# Patient Record
Sex: Male | Born: 1960 | Race: White | Hispanic: No | Marital: Single | Smoking: Never smoker
Health system: Southern US, Community
[De-identification: ages and names within clinical notes are randomized; demographics above are authoritative.]

## PROBLEM LIST (undated history)

## (undated) DIAGNOSIS — D751 Secondary polycythemia: Secondary | ICD-10-CM

## (undated) DIAGNOSIS — F419 Anxiety disorder, unspecified: Secondary | ICD-10-CM

## (undated) DIAGNOSIS — G250 Essential tremor: Secondary | ICD-10-CM

## (undated) DIAGNOSIS — F32A Depression, unspecified: Secondary | ICD-10-CM

## (undated) DIAGNOSIS — I10 Essential (primary) hypertension: Secondary | ICD-10-CM

## (undated) DIAGNOSIS — H5501 Congenital nystagmus: Secondary | ICD-10-CM

## (undated) DIAGNOSIS — F329 Major depressive disorder, single episode, unspecified: Secondary | ICD-10-CM

## (undated) DIAGNOSIS — T7840XA Allergy, unspecified, initial encounter: Secondary | ICD-10-CM

## (undated) DIAGNOSIS — K219 Gastro-esophageal reflux disease without esophagitis: Secondary | ICD-10-CM

## (undated) HISTORY — DX: Depression, unspecified: F32.A

## (undated) HISTORY — DX: Secondary polycythemia: D75.1

## (undated) HISTORY — DX: Allergy, unspecified, initial encounter: T78.40XA

## (undated) HISTORY — PX: EYE SURGERY: SHX253

## (undated) HISTORY — DX: Essential (primary) hypertension: I10

## (undated) HISTORY — DX: Congenital nystagmus: H55.01

## (undated) HISTORY — PX: SEPTOPLASTY: SUR1290

## (undated) HISTORY — DX: Gastro-esophageal reflux disease without esophagitis: K21.9

## (undated) HISTORY — DX: Anxiety disorder, unspecified: F41.9

## (undated) HISTORY — DX: Major depressive disorder, single episode, unspecified: F32.9

## (undated) HISTORY — DX: Essential tremor: G25.0

---

## 1999-10-05 ENCOUNTER — Ambulatory Visit (HOSPITAL_COMMUNITY): Admission: RE | Admit: 1999-10-05 | Discharge: 1999-10-05 | Payer: Self-pay | Admitting: Gastroenterology

## 2000-05-17 ENCOUNTER — Other Ambulatory Visit: Admission: RE | Admit: 2000-05-17 | Discharge: 2000-05-17 | Payer: Self-pay | Admitting: Gastroenterology

## 2002-11-28 ENCOUNTER — Ambulatory Visit (HOSPITAL_COMMUNITY): Admission: RE | Admit: 2002-11-28 | Discharge: 2002-11-28 | Payer: Self-pay | Admitting: Gastroenterology

## 2003-09-09 ENCOUNTER — Encounter: Admission: RE | Admit: 2003-09-09 | Discharge: 2003-09-09 | Payer: Self-pay | Admitting: Internal Medicine

## 2008-04-03 ENCOUNTER — Ambulatory Visit (HOSPITAL_COMMUNITY): Admission: RE | Admit: 2008-04-03 | Discharge: 2008-04-03 | Payer: Self-pay | Admitting: Otolaryngology

## 2010-06-16 ENCOUNTER — Ambulatory Visit: Payer: Self-pay | Admitting: Oncology

## 2010-07-07 LAB — CBC WITH DIFFERENTIAL/PLATELET
Basophils Absolute: 0 10*3/uL (ref 0.0–0.1)
EOS%: 1.4 % (ref 0.0–7.0)
Eosinophils Absolute: 0.1 10*3/uL (ref 0.0–0.5)
HGB: 17.1 g/dL (ref 13.0–17.1)
MCV: 88.1 fL (ref 79.3–98.0)
MONO%: 6.7 % (ref 0.0–14.0)
NEUT#: 3.9 10*3/uL (ref 1.5–6.5)
RBC: 5.54 10*6/uL (ref 4.20–5.82)
RDW: 13.1 % (ref 11.0–14.6)
lymph#: 2.5 10*3/uL (ref 0.9–3.3)

## 2010-07-07 LAB — MORPHOLOGY
PLT EST: ADEQUATE
RBC Comments: NORMAL

## 2010-07-08 LAB — SEDIMENTATION RATE: Sed Rate: 1 mm/hr (ref 0–16)

## 2010-07-08 LAB — ERYTHROPOIETIN: Erythropoietin: 11.5 m[IU]/mL (ref 2.6–34.0)

## 2010-07-30 ENCOUNTER — Ambulatory Visit: Payer: Self-pay | Admitting: Oncology

## 2010-08-09 LAB — JAK-2 V617F

## 2010-11-30 NOTE — Op Note (Signed)
[name], [name]            ACCOUNT NO.:  [id number]   MEDICAL RECORD NO.:  [id number]          PATIENT TYPE:  AMB   LOCATION:  SDS                          FACILITY:  [facility]   PHYSICIAN:  Kinnie Scales. Annalee Genta, M.D.DATE OF BIRTH:  26-Jul-1960   DATE OF PROCEDURE:  04/03/2008  DATE OF DISCHARGE:                               OPERATIVE REPORT   PREOPERATIVE DIAGNOSES:  1. 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  2. Bilateral inferior turbinate hypertrophy.  3. Chronic nasal airway obstruction.  4. Mild obstructive sleep apnea.   POSTOPERATIVE DIAGNOSES:  1. 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  2. Bilateral inferior turbinate hypertrophy.  3. Chronic nasal airway obstruction.  4. Mild obstructive sleep apnea.   INDICATIONS FOR SURGERY:  1. 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  2. Bilateral inferior turbinate hypertrophy.  3. Chronic nasal airway obstruction.  4. Mild obstructive sleep apnea.   SURGICAL PROCEDURES:  1. Revision nasal septoplasty.  2. Bilateral inferior turbinate reduction.   ANESTHESIA:  General endotracheal.   SURGEON:  Kinnie Scales. Annalee Genta, MD   COMPLICATIONS:  None.   ESTIMATED BLOOD LOSS:  Less than 50 mL.   The patient was transferred from the operating room to the recovery room  in stable condition.   BRIEF HISTORY:  Mr. [name] [name] is a 50 year old white male who is referred  to our office for evaluation of progressive nasal airway obstruction and  difficulty using nightly CPAP for the management of sleep apnea.  Prior  sleep study showed an AHI of 18 events per hour with an O2 nadir in high  80s.  The patient had been attempting to use CPAP on an ongoing basis,  but because of progressive nasal airway obstruction was having  significant difficulty using the device.  He had undergone prior nasal  septoplasty.  Examination in the office revealed a residual nasal septal  deviation with bony  septal spurring along the inferior aspect of the  right nasal cavity.  The patient also had significant bilateral inferior  turbinate hypertrophy.  After attempting topical nasal steroid therapy,  the patient returned to our office for reevaluation.  No significant  improvement in symptoms.  Given his history and findings, I recommended  to undertake revision nasal septoplasty and inferior turbinate  reduction.  Risks, benefits, and possible complications of the procedure  were discussed in detail with the patient who understood and concurred  to the plan for surgery, which is scheduled as an outpatient under  general anesthesia on April 03, 2008.   SURGICAL PROCEDURE:  The patient was brought to the operating room at  [facility] OR and placed in supine position on the  operating table.  General endotracheal anesthesia was established  without difficulty.  When the patient was adequately anesthetized, his  nose was injected with 8 mL of 1% lidocaine with 1:100,000 solution  epinephrine, which was injected in submucosal fashion along the nasal  septum and inferior turbinates bilaterally.  The patient's nose was then  packed with Afrin-soaked cottonoid pledgets, which was placed for  approximately 10 minutes  to allow for vasoconstriction.  The patient was  then positioned on the operating table and prepared for surgery.   A right anterior hemitransfixion incision was created.  A  mucoperichondrial flap was elevated from anterior to posterior along the  patient's right-hand side.  The patient had undergone prior nasal  septoplasty.  There was a moderate amount of adhesion and scarring of  the flaps without significant septal cartilage.  The flaps were then  gently elevated and the overlying soft tissue and mucosa along the  inferior aspect of the right nasal septum was further elevated in order  to delineate the inferior septal spur.  Preserving the overlying mucosa,  the  nasal spur was resected using a 4-mm osteotome.  The remainder of  the nasal septum was in relatively good position and no further  dissection was undertaken.  The mucosal flaps were reapproximated with a  4-0 gut suture on a Keith needle in a horizontal mattress fashion and  bilateral Doyle nasal septal splints were placed after the application  of Bactroban ointment and were sutured in position with a 3-0 Ethilon  suture.   With septoplasty complete, inferior turbinate reduction was begun using  Bovie electrocautery set at 12 watts.  Bipolar intramural cautery was  performed.  Two submucosal passes were made in each inferior turbinate.  When the turbinates had been adequately cauterized, an anterior incision  was created and a small amount of turbinate bone was resected preserving  the overlying mucosa.  The turbinates were then outfractured obtaining  more patent nasal cavity.  The patient's nasal cavity, nasopharynx, oral  cavity, and oropharynx were irrigated and suctioned.  Orogastric tube  was passed.  The patient was awakened from his anesthetic, he was  extubated, and was transferred from the operating room to the recovery  room in stable condition.  There were no complications.  The blood loss  was less than 50 mL.           ______________________________  Kinnie Scales. Annalee Genta, M.D.     DLS/MEDQ  D:  81/19/1478  T:  04/03/2008  Job:  [id number]

## 2010-12-07 ENCOUNTER — Encounter: Payer: Self-pay | Admitting: Internal Medicine

## 2010-12-29 ENCOUNTER — Encounter: Payer: Self-pay | Admitting: Internal Medicine

## 2010-12-29 ENCOUNTER — Ambulatory Visit (AMBULATORY_SURGERY_CENTER): Payer: 59 | Admitting: *Deleted

## 2010-12-29 VITALS — Ht 68.0 in | Wt 220.9 lb

## 2010-12-29 DIAGNOSIS — Z1211 Encounter for screening for malignant neoplasm of colon: Secondary | ICD-10-CM

## 2010-12-29 MED ORDER — PEG-KCL-NACL-NASULF-NA ASC-C 100 G PO SOLR: ORAL | Status: DC

## 2011-01-12 ENCOUNTER — Other Ambulatory Visit: Payer: Self-pay | Admitting: Internal Medicine

## 2011-02-01 ENCOUNTER — Ambulatory Visit (AMBULATORY_SURGERY_CENTER): Payer: 59 | Admitting: Internal Medicine

## 2011-02-01 ENCOUNTER — Encounter: Payer: Self-pay | Admitting: Internal Medicine

## 2011-02-01 VITALS — BP 130/81 | HR 88 | Temp 98.2°F | Resp 18 | Ht 69.0 in | Wt 210.0 lb

## 2011-02-01 DIAGNOSIS — Z1211 Encounter for screening for malignant neoplasm of colon: Secondary | ICD-10-CM

## 2011-02-01 MED ORDER — SODIUM CHLORIDE 0.9 % IV SOLN: 500.0000 mL | INTRAVENOUS | Status: DC

## 2011-02-01 NOTE — Patient Instructions (Signed)
Discharge instructions given with verbal understanding. Normal procedure. Resume previous medications. 

## 2011-02-02 ENCOUNTER — Telehealth: Payer: Self-pay | Admitting: *Deleted

## 2011-02-02 NOTE — Telephone Encounter (Signed)
Answering machine did not have a caller ID so no message left

## 2011-03-02 NOTE — Progress Notes (Signed)
Addended by: Virgel Paling on: 03/02/2011 03:34 PM   Modules accepted: Orders

## 2011-04-18 LAB — CBC
HCT: 53.6 — ABNORMAL HIGH
MCHC: 34.2
MCV: 87.9
RBC: 6.09 — ABNORMAL HIGH
WBC: 8.9

## 2011-07-07 ENCOUNTER — Telehealth: Payer: Self-pay | Admitting: Oncology

## 2011-07-07 NOTE — Telephone Encounter (Signed)
l/m on cell # with appt and also mailed aom °

## 2011-09-26 ENCOUNTER — Encounter: Payer: Self-pay | Admitting: Oncology

## 2011-09-26 ENCOUNTER — Ambulatory Visit: Payer: 59 | Admitting: Oncology

## 2011-09-26 ENCOUNTER — Other Ambulatory Visit (HOSPITAL_BASED_OUTPATIENT_CLINIC_OR_DEPARTMENT_OTHER): Payer: 59 | Admitting: Lab

## 2011-09-26 DIAGNOSIS — D751 Secondary polycythemia: Secondary | ICD-10-CM

## 2011-09-26 DIAGNOSIS — D582 Other hemoglobinopathies: Secondary | ICD-10-CM

## 2011-09-26 DIAGNOSIS — H5501 Congenital nystagmus: Secondary | ICD-10-CM

## 2011-09-26 HISTORY — DX: Congenital nystagmus: H55.01

## 2011-09-26 HISTORY — DX: Secondary polycythemia: D75.1

## 2011-09-26 LAB — CBC WITH DIFFERENTIAL/PLATELET
BASO%: 1.1 % (ref 0.0–2.0)
EOS%: 0.5 % (ref 0.0–7.0)
HCT: 51.1 % — ABNORMAL HIGH (ref 38.4–49.9)
LYMPH%: 27.2 % (ref 14.0–49.0)
MCH: 29.6 pg (ref 27.2–33.4)
MCHC: 34 g/dL (ref 32.0–36.0)
MONO%: 5.5 % (ref 0.0–14.0)
NEUT%: 65.7 % (ref 39.0–75.0)
Platelets: 277 10*3/uL (ref 140–400)
RBC: 5.87 10*6/uL — ABNORMAL HIGH (ref 4.20–5.82)
WBC: 8.1 10*3/uL (ref 4.0–10.3)

## 2011-09-26 NOTE — Progress Notes (Signed)
Hematology and Oncology Follow Up Visit  [name] [name] [id number] 09-18-1960 50 y.o. 09/26/2011 8:47 PM   Principle Diagnosis: Encounter Diagnoses  Name Primary?  . Polycythemia, secondary   . Congenital nystagmus      Interim History:   Followup visit for this pleasant 51 year old man referred to our office in December 2011 for further evaluation of an isolated elevation of his hemoglobin and hematocrit. Lab at that time with a hemoglobin 17.1 hematocrit 48.8 with normal white blood count and platelet count. Evaluation included a serum erythropoietin which was normal at 11.5, a JAK-2 gene analysis which did not show a mutation in this gene, and a P. 50 oxygen dissociation curve which was abnormal at 20 mm with reference range 22-28 mm suggesting that he had a high oxygen affinity hemoglobin as the etiology of his polycythemia. Lab today remains stable compared with previous values with hemoglobin currently 17.4 hematocrit 51 white count 8100 and platelet count 277,000. Medications: reviewed  Allergies: No Known Allergies   Remaining ROS negative.  Physical Exam: Not examined today Blood pressure 123/86, pulse 138, temperature 98.2 F (36.8 C), temperature source Oral, height 5\' 9"  (1.753 m), weight 213 lb 3.2 oz (96.707 kg). 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  CC:. Dr. Larina Earthly   [name],  MD 3/11/20138:47 PM

## 2011-10-05 ENCOUNTER — Encounter: Payer: Self-pay | Admitting: Oncology

## 2015-02-10 ENCOUNTER — Other Ambulatory Visit: Payer: Self-pay | Admitting: Orthopedic Surgery

## 2015-02-10 DIAGNOSIS — M546 Pain in thoracic spine: Secondary | ICD-10-CM

## 2015-02-10 DIAGNOSIS — M545 Low back pain: Secondary | ICD-10-CM

## 2016-06-13 ENCOUNTER — Encounter (HOSPITAL_COMMUNITY): Payer: Self-pay | Admitting: Emergency Medicine

## 2016-06-13 ENCOUNTER — Emergency Department (HOSPITAL_COMMUNITY): Payer: Managed Care, Other (non HMO)

## 2016-06-13 ENCOUNTER — Emergency Department (HOSPITAL_COMMUNITY)
Admission: EM | Admit: 2016-06-13 | Discharge: 2016-06-13 | Disposition: A | Discharge status: Home / Self Care | Payer: Managed Care, Other (non HMO) | Attending: Emergency Medicine | Admitting: Emergency Medicine

## 2016-06-13 DIAGNOSIS — J40 Bronchitis, not specified as acute or chronic: Secondary | ICD-10-CM | POA: Insufficient documentation

## 2016-06-13 DIAGNOSIS — J029 Acute pharyngitis, unspecified: Secondary | ICD-10-CM | POA: Diagnosis not present

## 2016-06-13 DIAGNOSIS — R05 Cough: Secondary | ICD-10-CM | POA: Diagnosis present

## 2016-06-13 LAB — CBC
HEMATOCRIT: 50.8 % (ref 39.0–52.0)
HEMOGLOBIN: 17.9 g/dL — AB (ref 13.0–17.0)
MCH: 30 pg (ref 26.0–34.0)
MCHC: 35.2 g/dL (ref 30.0–36.0)
MCV: 85.1 fL (ref 78.0–100.0)
Platelets: 201 10*3/uL (ref 150–400)
RBC: 5.97 MIL/uL — ABNORMAL HIGH (ref 4.22–5.81)
RDW: 12.5 % (ref 11.5–15.5)
WBC: 10.1 10*3/uL (ref 4.0–10.5)

## 2016-06-13 LAB — URINALYSIS, ROUTINE W REFLEX MICROSCOPIC
Glucose, UA: NEGATIVE mg/dL
Hgb urine dipstick: NEGATIVE
KETONES UR: 40 mg/dL — AB
LEUKOCYTES UA: NEGATIVE
NITRITE: NEGATIVE
PH: 6 (ref 5.0–8.0)
Protein, ur: 30 mg/dL — AB
SPECIFIC GRAVITY, URINE: 1.027 (ref 1.005–1.030)

## 2016-06-13 LAB — BASIC METABOLIC PANEL
ANION GAP: 9 (ref 5–15)
BUN: 18 mg/dL (ref 6–20)
CHLORIDE: 98 mmol/L — AB (ref 101–111)
CO2: 28 mmol/L (ref 22–32)
Calcium: 8.8 mg/dL — ABNORMAL LOW (ref 8.9–10.3)
Creatinine, Ser: 1.01 mg/dL (ref 0.61–1.24)
GFR calc Af Amer: >60 mL/min (ref >60)
GLUCOSE: 102 mg/dL — AB (ref 65–99)
POTASSIUM: 4.1 mmol/L (ref 3.5–5.1)
Sodium: 135 mmol/L (ref 135–145)

## 2016-06-13 LAB — RAPID STREP SCREEN (MED CTR MEBANE ONLY): STREPTOCOCCUS, GROUP A SCREEN (DIRECT): NEGATIVE

## 2016-06-13 LAB — INFLUENZA PANEL BY PCR (TYPE A & B)
Influenza A By PCR: NEGATIVE
Influenza B By PCR: NEGATIVE

## 2016-06-13 LAB — CBG MONITORING, ED: GLUCOSE-CAPILLARY: 94 mg/dL (ref 65–99)

## 2016-06-13 LAB — URINE MICROSCOPIC-ADD ON
Bacteria, UA: NONE SEEN
WBC, UA: NONE SEEN WBC/hpf (ref 0–5)

## 2016-06-13 MED ORDER — SODIUM CHLORIDE 0.9 % IV BOLUS (SEPSIS)
1000.0000 mL | Freq: Once | INTRAVENOUS | Status: AC
  Administered 2016-06-13: 1000 mL via INTRAVENOUS

## 2016-06-13 MED ORDER — KETOROLAC TROMETHAMINE 30 MG/ML IJ SOLN
30.0000 mg | Freq: Once | INTRAMUSCULAR | Status: AC
  Administered 2016-06-13: 30 mg via INTRAVENOUS
  Filled 2016-06-13: qty 1

## 2016-06-13 NOTE — ED Notes (Signed)
I attempted twice to collect labs and was unsuccessful 

## 2016-06-13 NOTE — ED Triage Notes (Signed)
Per EMS, pt c/o generalized body aches and sore throat since Thursday. Pt A&Ox4 and ambulatory downstairs to EMS stretcher. Pt c/o congestion and dry cough.

## 2016-06-13 NOTE — ED Provider Notes (Signed)
[facility]-EMERGENCY DEPT Provider Note   CSN: [id number] Arrival date & time: 06/13/16  1027     History   Chief Complaint Chief Complaint  Patient presents with  . Sore Throat  . Generalized Body Aches  . Dizziness    HPI [name] [name] is a 55 y.o. male who presents with multiple symptoms. Past medical history significant for polycythemia, anxiety/depression, asthma. He states that he started to feel ill 4 days ago. Symptoms started gradually and worsened. He reports malaise, decreased by mouth intake, lightheadedness without syncope, headache, sore throat, productive cough. Denies fever, chills, ear pain, nasal congestion, sinus pressure, inability to swallow, shortness of breath, chest pain, abdominal pain, nausea, vomiting. He has been taking cough and cold medicine with minimal relief. Denies sick contacts. He has received his flu shot.   HPI  Past Medical History:  Diagnosis Date  . Allergy   . Anxiety   . Asthma   . 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 Depression   . Polycythemia, secondary 09/26/2011    Patient Active Problem List   Diagnosis Date Noted  . Polycythemia, secondary 09/26/2011  . Congenital nystagmus 09/26/2011    Past Surgical History:  Procedure Laterality Date  . SEPTOPLASTY         Home Medications    Prior to Admission medications   Medication Sig Start Date End Date Taking? Authorizing Provider  amphetamine-dextroamphetamine (ADDERALL XR) 25 MG 24 hr capsule Take 25 mg by mouth every morning. Only Monday- Friday   Yes Historical Provider, MD  carvedilol (COREG) 6.25 MG tablet Take 6.25 mg by mouth 2 (two) times daily as needed for tremors. 05/03/16  Yes Historical Provider, MD  cetirizine (ZYRTEC) 5 MG tablet Take 5 mg by mouth daily as needed for allergies.    Yes Historical Provider, MD  clonazePAM (KLONOPIN) 1 MG tablet Take 1 mg by mouth at bedtime as needed for anxiety (sleep).   Yes Historical Provider, MD  cyclobenzaprine (FLEXERIL)  10 MG tablet Take 10 mg by mouth 3 (three) times daily as needed for muscle spasms.    Yes Historical Provider, MD  escitalopram (LEXAPRO) 20 MG tablet Take 20 mg by mouth daily. 06/05/16  Yes Historical Provider, MD  HYDROcodone-acetaminophen (NORCO/VICODIN) 5-325 MG tablet Take 1 tablet by mouth every 6 (six) hours as needed for pain. 05/31/16  Yes Historical Provider, MD  nebivolol (BYSTOLIC) 5 MG tablet Take 5 mg by mouth daily.     Yes Historical Provider, MD  traZODone (DESYREL) 100 MG tablet Take 100 mg by mouth at bedtime as needed for sleep. 06/06/16  Yes Historical Provider, MD    Family History No family history on file.  Social History Social History  Substance Use Topics  . Smoking status: Never Smoker  . Smokeless tobacco: Never Used  . Alcohol use 0.6 oz/week    1 Glasses of wine per week     Allergies   Patient has no known allergies.   Review of Systems Review of Systems  Constitutional: Positive for activity change, appetite change and fatigue (malaise). Negative for chills and fever.  HENT: Positive for sore throat. Negative for congestion, ear pain, rhinorrhea, sinus pain and trouble swallowing.   Respiratory: Positive for cough. Negative for shortness of breath and wheezing.   Cardiovascular: Negative for chest pain.  Gastrointestinal: Negative for abdominal pain, nausea and vomiting.  Neurological: Positive for light-headedness. Negative for syncope.     Physical Exam Updated Vital Signs BP 118/93 (BP Location: Right Arm)  Pulse (!) 122   Temp 98.9 F (37.2 C) (Oral)   Resp 18   SpO2 92%   Physical Exam  Constitutional: He is oriented to person, place, and time. He appears well-developed and well-nourished. He appears ill. No distress.  HENT:  Head: Normocephalic and atraumatic.  Right Ear: Hearing, tympanic membrane, external ear and ear canal normal.  Left Ear: Hearing, tympanic membrane, external ear and ear canal normal.  Nose: Nose normal.  Right sinus exhibits no maxillary sinus tenderness and no frontal sinus tenderness. Left sinus exhibits no maxillary sinus tenderness and no frontal sinus tenderness.  Mouth/Throat: Uvula is midline. Mucous membranes are dry. Uvula swelling present. Posterior oropharyngeal erythema (Difficult to visualize) present. No oropharyngeal exudate, posterior oropharyngeal edema or tonsillar abscesses. No tonsillar exudate.  Eyes: Conjunctivae are normal. Pupils are equal, round, and reactive to light. Right eye exhibits no discharge. Left eye exhibits no discharge. No scleral icterus.  Neck: Normal range of motion.  Cardiovascular: Regular rhythm.  Tachycardia present.  Exam reveals no gallop and no friction rub.   No murmur heard. Pulmonary/Chest: Effort normal. No respiratory distress. He has no decreased breath sounds. He has no wheezes. He has rhonchi. He has no rales. He exhibits no tenderness.  Scattered rhonchi cleared with coughing  Abdominal: Soft. Bowel sounds are normal. He exhibits no distension and no mass. There is no tenderness. There is no rebound and no guarding. No hernia.  Neurological: He is alert and oriented to person, place, and time.  Skin: Skin is warm and dry.  Psychiatric: He has a normal mood and affect. His behavior is normal.  Nursing note and vitals reviewed.    ED Treatments / Results  Labs (all labs ordered are listed, but only abnormal results are displayed) Labs Reviewed  BASIC METABOLIC PANEL - Abnormal; Notable for the following:       Result Value   Chloride 98 (*)    Glucose, Bld 102 (*)    Calcium 8.8 (*)    All other components within normal limits  CBC - Abnormal; Notable for the following:    RBC 5.97 (*)    Hemoglobin 17.9 (*)    All other components within normal limits  URINALYSIS, ROUTINE W REFLEX MICROSCOPIC (NOT AT [facility]) - Abnormal; Notable for the following:    Color, Urine AMBER (*)    Bilirubin Urine SMALL (*)    Ketones, ur 40 (*)     Protein, ur 30 (*)    All other components within normal limits  URINE MICROSCOPIC-ADD ON - Abnormal; Notable for the following:    Squamous Epithelial / LPF 0-5 (*)    All other components within normal limits  RAPID STREP SCREEN (NOT AT [facility])  CULTURE, GROUP A STREP ([facility])  INFLUENZA PANEL BY PCR (TYPE A & B, H1N1)  CBG MONITORING, ED    EKG  EKG Interpretation None       Radiology No results found.  Procedures Procedures (including critical care time)  Medications Ordered in ED Medications  sodium chloride 0.9 % bolus 1,000 mL (0 mLs Intravenous Stopped 06/13/16 1640)  ketorolac (TORADOL) 30 MG/ML injection 30 mg (30 mg Intravenous Given 06/13/16 1345)     Initial Impression / Assessment and Plan / ED Course  I have reviewed the triage vital signs and the nursing notes.  Pertinent labs & imaging results that were available during my care of the patient were reviewed by me and considered in my medical decision making (see  chart for details).  Clinical Course    55 year old male presents with sore throat and bronchitis. He is initially tachycardic which has improved with fluids. Patient is afebrile, and not tachypneic, normotensive, and not hypoxic. Strep test is negative. CBC remarkable for high hgb. BMP overall unremarkable. UA remarkable for ketones. CXR remarkable for bronchitic changes. IVF and Toradol given.  On recheck patient reports feeling much better. Advised plenty of fluids and continuing OTC meds. Flu test sent per patient request. Patient is NAD, non-toxic, with stable VS. Patient is informed of clinical course, understands medical decision making process, and agrees with plan. Opportunity for questions provided and all questions answered. Return precautions given.   Final Clinical Impressions(s) / ED Diagnoses   Final diagnoses:  Bronchitis  Sore throat    New Prescriptions New Prescriptions   No medications on file     Bethel BornKelly Marie Gekas,  PA-C 06/13/16 1646    Maia PlanJoshua G Long, MD 06/14/16 [phone]

## 2016-06-13 NOTE — ED Triage Notes (Signed)
Pt c/o sore throat, generalized body aches, congestion, and dizziness since Thursday. Pt Denies chest pain or SOB. A&Ox4 and ambulatory. Pt Temp 98.8 in triage. Pt sts he has taken OTC medications but is unable to tell this RN what it was.

## 2016-06-13 NOTE — Discharge Instructions (Signed)
Drink plenty of fluids Continue over the counter meds including Mucinex and Ibuprofen 400mg  three times daily Flu test has been sent. Please check MyChart for results.

## 2016-06-16 LAB — CULTURE, GROUP A STREP (THRC)

## 2018-06-01 IMAGING — CR DG CHEST 2V
2 series · 2 of 2 positions shown · non-contrast
Comparison: 04/01/2008 chest x-ray.

CLINICAL DATA: 55-year-old male with generalized body aches and
sore throat. Cough and congestion. Nonsmoker. Initial encounter.

EXAM:
CHEST  2 VIEW

[w chest lat]
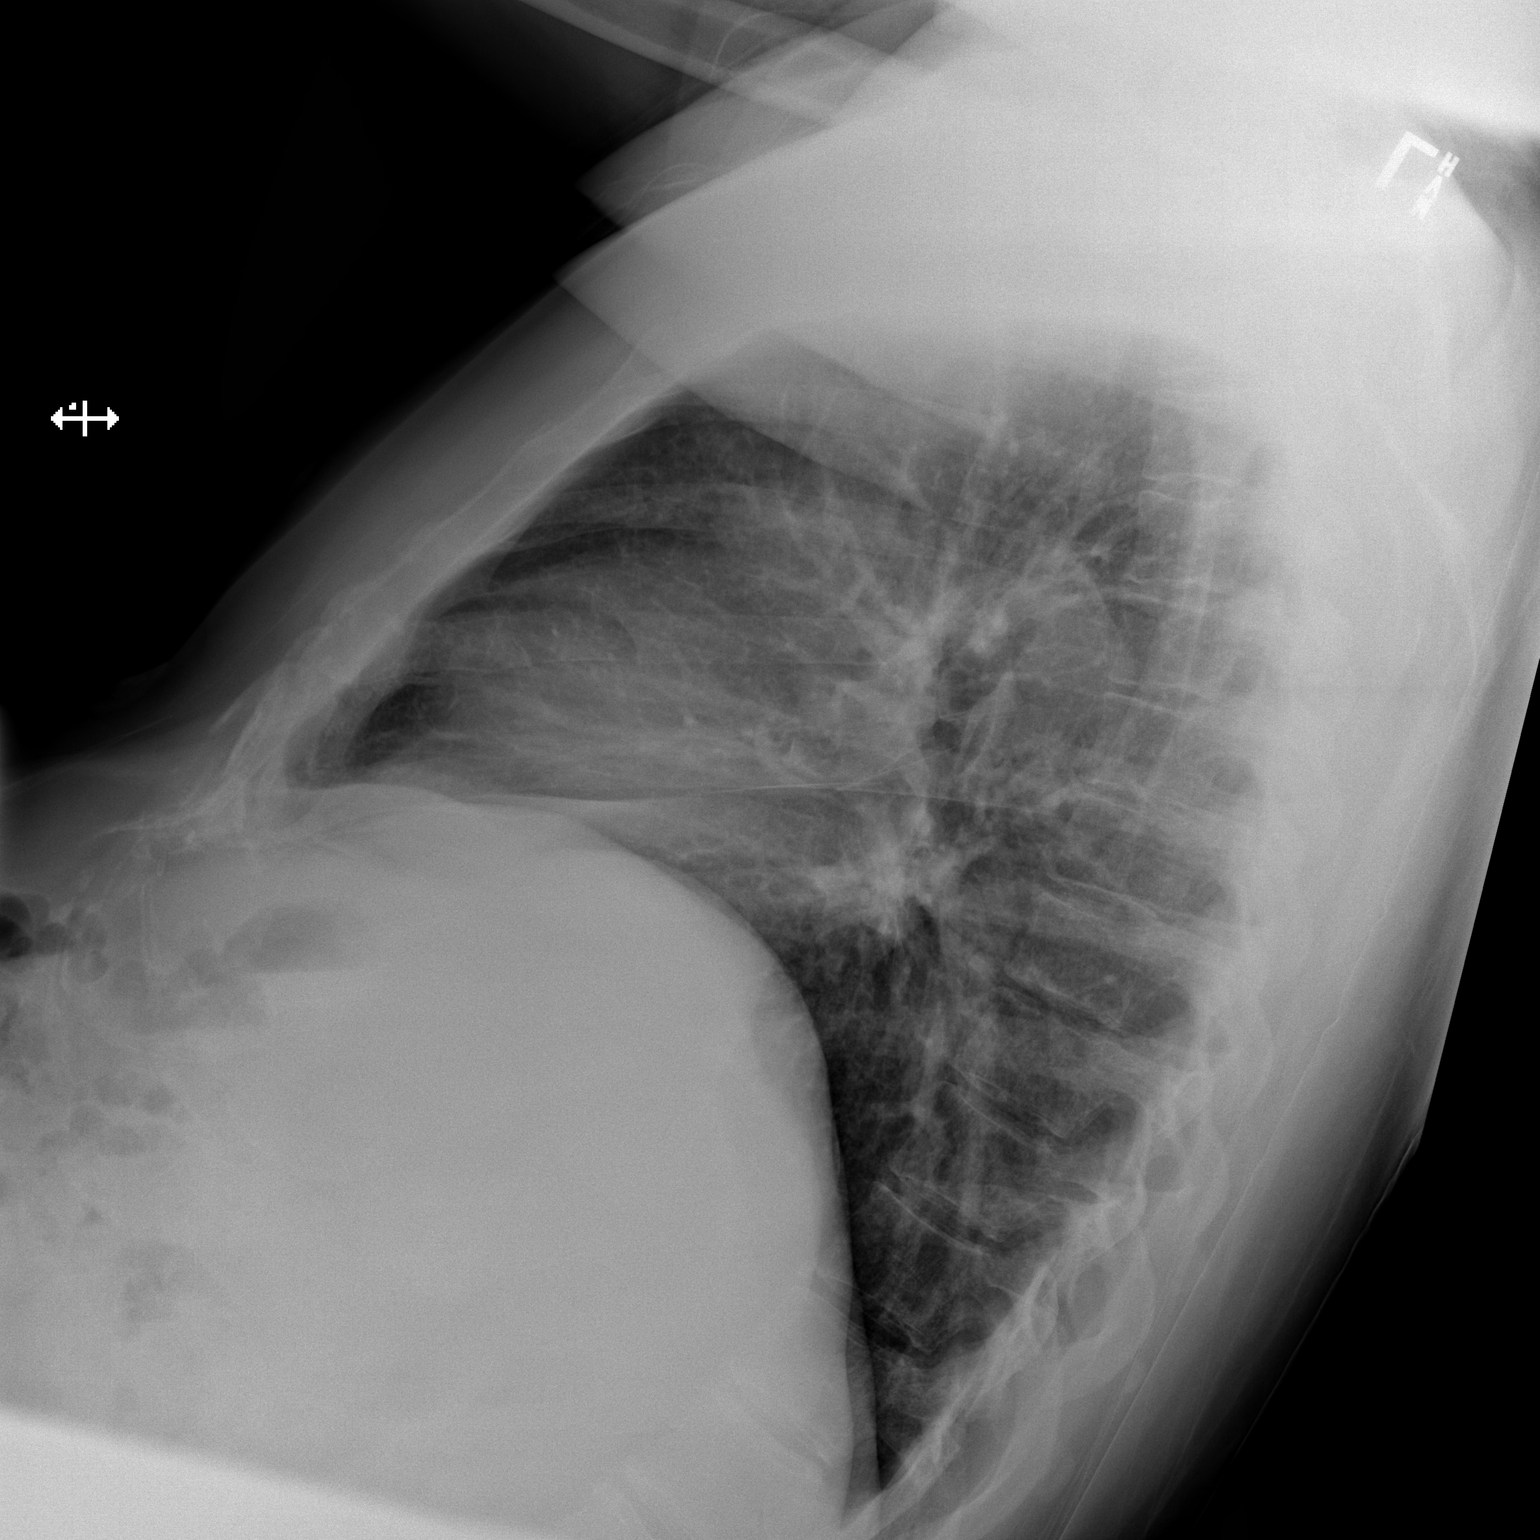

[x chest ap]
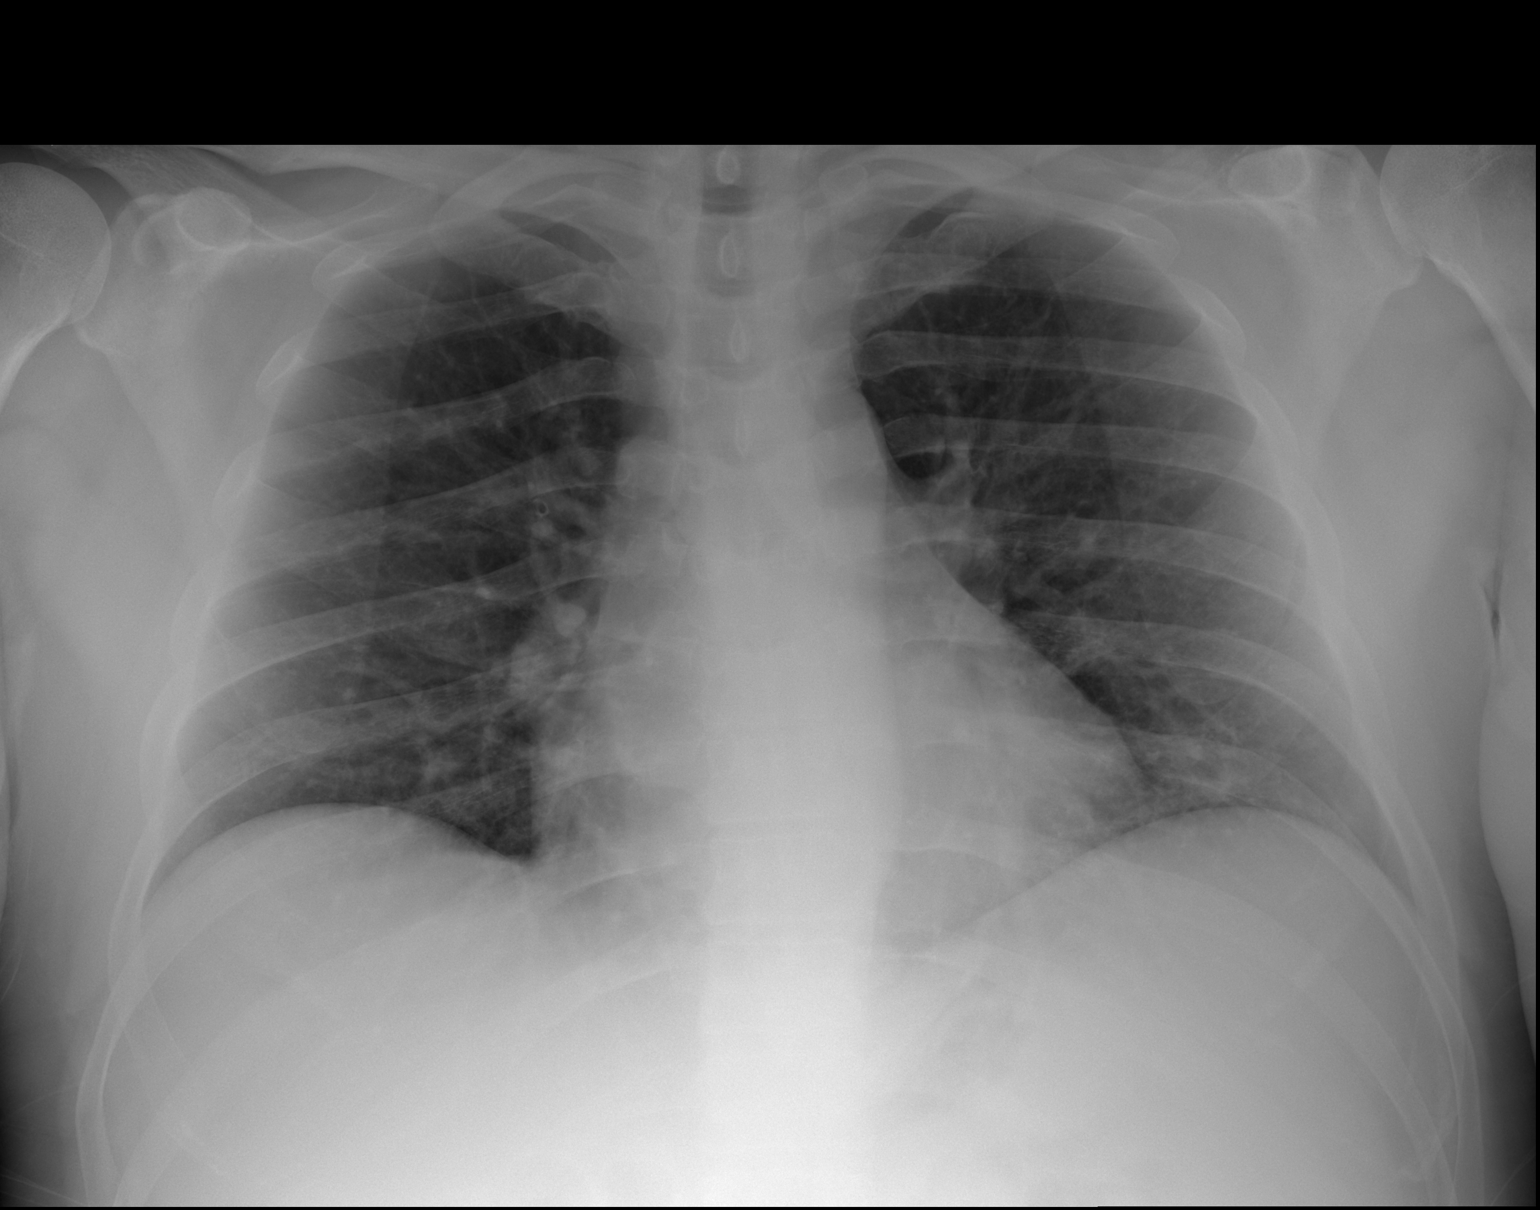

[2 of 2 positions shown; findings below may reference images not displayed]

FINDINGS: Peribronchial thickening suggestive of bronchitis type changes.

Left base subsegmental atelectasis suspected without segmental
consolidation detected.

Central pulmonary vascular prominence without pulmonary edema.

No pneumothorax.

No plain film evidence of pulmonary malignancy.

Heart size within normal limits.

No acute osseous abnormality.
IMPRESSION: Peribronchial thickening suggestive of bronchitis type changes.

Left base subsegmental atelectasis suspected without segmental
consolidation detected.

Central pulmonary vascular prominence without pulmonary edema.

## 2019-02-27 ENCOUNTER — Other Ambulatory Visit: Payer: Self-pay

## 2019-02-27 ENCOUNTER — Encounter: Payer: Self-pay | Admitting: Adult Health

## 2019-02-27 ENCOUNTER — Ambulatory Visit (INDEPENDENT_AMBULATORY_CARE_PROVIDER_SITE_OTHER): Payer: 59 | Admitting: Adult Health

## 2019-02-27 DIAGNOSIS — G4700 Insomnia, unspecified: Secondary | ICD-10-CM

## 2019-02-27 DIAGNOSIS — F902 Attention-deficit hyperactivity disorder, combined type: Secondary | ICD-10-CM

## 2019-02-27 DIAGNOSIS — F332 Major depressive disorder, recurrent severe without psychotic features: Secondary | ICD-10-CM | POA: Diagnosis not present

## 2019-02-27 DIAGNOSIS — F411 Generalized anxiety disorder: Secondary | ICD-10-CM

## 2019-02-27 MED ORDER — AMPHETAMINE-DEXTROAMPHET ER 25 MG PO CP24: 25.0000 mg | ORAL_CAPSULE | ORAL | 0 refills | Status: DC

## 2019-02-27 MED ORDER — ESCITALOPRAM OXALATE 20 MG PO TABS: 20.0000 mg | ORAL_TABLET | Freq: Every day | ORAL | 0 refills | Status: DC

## 2019-02-27 MED ORDER — CLONAZEPAM 1 MG PO TABS
1.0000 mg | ORAL_TABLET | Freq: Every evening | ORAL | 0 refills | Status: DC | PRN
Start: 2019-02-27 — End: 2019-02-28

## 2019-02-27 NOTE — Progress Notes (Signed)
[name] [name] [id number] 01/07/1961 58 y.o.  Subjective:   Patient ID:  [name] [name] is a 58 y.o. (DOB 02/07/1961) male.  Chief Complaint:  Chief Complaint  Patient presents with  . ADHD  . Depression  . Anxiety    HPI [name] [name] presents to the office today for follow-up of anxiety, depression, insomnia, and ADHD.  Describes mood today as "ok". Mood symptoms - feels depressed, anxious, and irritable. Has struggled with symptoms since the onset of Covid-19. Had stopped taking an SSRI prior to Covid-1. He historically changes back and forth between Prozac and Lexapro. Both agents work well for him, but do not last but for about 2 years. Has not taken either in several years, but feels like he is unable to manage current symptoms on his own. Would like to restart Lexapro. Is also taking Adderall XR 25mg  and Clonazepam 1mg  at hs. He feels these medications are working well.   Energy levels stable. Active, has a regular exercise routine.  Works full time. Enjoys some usual interests and activities. He and partner mostly staying home. Working from home. Does get out to pick up groceries. Appetite adequate. Weight stable. Sleeps well most nights with Clonazepam. Averages 6 to 8 hours. Focus and concentration stable. Completing tasks. Managing aspects of household. Work going well.  Denies SI or HI. Denies AH or VH.   Review of Systems:  Review of Systems  Musculoskeletal: Negative for gait problem.  Neurological: Negative for tremors.  Psychiatric/Behavioral:       Please refer to HPI    Medications: I have reviewed the patient's current medications.  Current Outpatient Medications  Medication Sig Dispense Refill  . amphetamine-dextroamphetamine (ADDERALL XR) 25 MG 24 hr capsule Take 25 mg by mouth every morning. Only Monday- Friday    . amphetamine-dextroamphetamine (ADDERALL XR) 25 MG 24 hr capsule Take 1 capsule by mouth every morning. 90 capsule 0  . carvedilol  (COREG) 12.5 MG tablet     . carvedilol (COREG) 6.25 MG tablet Take 6.25 mg by mouth 2 (two) times daily as needed for tremors.    . cetirizine (ZYRTEC) 5 MG tablet Take 5 mg by mouth daily as needed for allergies.     . clonazePAM (KLONOPIN) 1 MG tablet Take 1 tablet (1 mg total) by mouth at bedtime as needed for anxiety (sleep). 90 tablet 0  . cyclobenzaprine (FLEXERIL) 10 MG tablet Take 10 mg by mouth 3 (three) times daily as needed for muscle spasms.     Marland Kitchen. escitalopram (LEXAPRO) 20 MG tablet Take 1 tablet (20 mg total) by mouth daily. 90 tablet 0  . gabapentin (NEURONTIN) 300 MG capsule     . HYDROcodone-acetaminophen (NORCO/VICODIN) 5-325 MG tablet Take 1 tablet by mouth every 6 (six) hours as needed for pain.    . nebivolol (BYSTOLIC) 5 MG tablet Take 5 mg by mouth daily.      . tadalafil (CIALIS) 5 MG tablet     . traZODone (DESYREL) 100 MG tablet Take 100 mg by mouth at bedtime as needed for sleep.    Marland Kitchen. tretinoin (ALTRALIN) 0.05 % gel      No current facility-administered medications for this visit.     Medication Side Effects: None reported.  Allergies: No Known Allergies  Past Medical History:  Diagnosis Date  . Allergy   . Anxiety   . Asthma   . 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 Depression   . 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 No pertinent  family history.  Social History   Socioeconomic History  . Marital status: Single    Spouse name: Not on file  . Number of children: Not on file  . Years of education: Not on file  . Highest education level: Not on file  Occupational History  . Not on file  Social Needs  . Financial resource strain: Not on file  . Food insecurity    Worry: Not on file    Inability: Not on file  . Transportation needs    Medical: Not on file    Non-medical: Not on file  Tobacco Use  . Smoking status: Never Smoker  . Smokeless tobacco: Never Used  Substance and Sexual Activity  . Alcohol use: Yes    Alcohol/week:  1.0 standard drinks    Types: 1 Glasses of wine per week  . Drug use: No  . Sexual activity: Not on file  Lifestyle  . Physical activity    Days per week: Not on file    Minutes per session: Not on file  . Stress: Not on file  Relationships  . Social Musicianconnections    Talks on phone: Not on file    Gets together: Not on file    Attends religious service: Not on file    Active member of club or organization: Not on file    Attends meetings of clubs or organizations: Not on file    Relationship status: Not on file  . Intimate partner violence    Fear of current or ex partner: Not on file    Emotionally abused: Not on file    Physically abused: Not on file    Forced sexual activity: Not on file  Other Topics Concern  . Not on file  Social History Narrative  . Not on file    Past Medical History, Surgical history, Social history, and Family history were reviewed and updated as appropriate.   Please see review of systems for further details on the patient's review from today.   Objective:   Physical Exam:  There were no vitals taken for this visit.  Physical Exam Constitutional:      General: He is not in acute distress.    Appearance: He is well-developed.  Musculoskeletal:        General: No deformity.  Neurological:     Mental Status: He is alert and oriented to person, place, and time.     Coordination: Coordination normal.  Psychiatric:        Attention and Perception: Attention and perception normal. He does not perceive auditory or visual hallucinations.        Mood and Affect: Mood normal. Mood is not anxious or depressed. Affect is not labile, blunt, angry or inappropriate.        Speech: Speech normal.        Behavior: Behavior normal.        Thought Content: Thought content normal. Thought content is not paranoid or delusional. Thought content does not include homicidal or suicidal ideation. Thought content does not include homicidal or suicidal plan.         Cognition and Memory: Cognition and memory normal.        Judgment: Judgment normal.     Comments: Insight intact     Lab Review:     Component Value Date/Time   NA 135 06/13/2016 1117   K 4.1 06/13/2016 1117   CL 98 (L) 06/13/2016 1117   CO2 28 06/13/2016 1117  GLUCOSE 102 (H) 06/13/2016 1117   BUN 18 06/13/2016 1117   CREATININE 1.01 06/13/2016 1117   CALCIUM 8.8 (L) 06/13/2016 1117   GFRNONAA >60 06/13/2016 1117   GFRAA >60 06/13/2016 1117       Component Value Date/Time   WBC 10.1 06/13/2016 1117   RBC 5.97 (H) 06/13/2016 1117   HGB 17.9 (H) 06/13/2016 1117   HGB 17.4 (H) 09/26/2011 1016   HCT 50.8 06/13/2016 1117   HCT 51.1 (H) 09/26/2011 1016   PLT 201 06/13/2016 1117   PLT 277 09/26/2011 1016   MCV 85.1 06/13/2016 1117   MCV 87.0 09/26/2011 1016   MCH 30.0 06/13/2016 1117   MCHC 35.2 06/13/2016 1117   RDW 12.5 06/13/2016 1117   RDW 12.6 09/26/2011 1016   LYMPHSABS 2.2 09/26/2011 1016   MONOABS 0.5 09/26/2011 1016   EOSABS 0.0 09/26/2011 1016   BASOSABS 0.1 09/26/2011 1016    No results found for: POCLITH, LITHIUM   No results found for: PHENYTOIN, PHENOBARB, VALPROATE, CBMZ   .res Assessment: Plan:    Plan: 1. Continue Clonazepam 2. Continue Adderall XR 25mg  daily 3. Restart Lexapro 20mg  tablet - take 1/2 tab daily for 7 days, then one tablet daily.  Will send a 30 day supply of meds to local pharmacy and then 90 day supply to [place].  RTC 3 months  Discussed potential benefits, risks, and side effects of stimulants with patient to include increased heart rate, palpitations, insomnia, increased anxiety, increased irritability, or decreased appetite.  Instructed patient to contact office if experiencing any significant tolerability issues.  Patient advised to contact office with any questions, adverse effects, or acute worsening in signs and symptoms.    [place] was seen today for adhd, depression and anxiety.  Diagnoses and all orders  for this visit:  Generalized anxiety disorder -     Discontinue: clonazePAM (KLONOPIN) 1 MG tablet; Take 1 tablet (1 mg total) by mouth at bedtime as needed for anxiety (sleep). -     Discontinue: escitalopram (LEXAPRO) 20 MG tablet; Take 1 tablet (20 mg total) by mouth daily. -     escitalopram (LEXAPRO) 20 MG tablet; Take 1 tablet (20 mg total) by mouth daily. -     clonazePAM (KLONOPIN) 1 MG tablet; Take 1 tablet (1 mg total) by mouth at bedtime as needed for anxiety (sleep).  Severe episode of recurrent major depressive disorder, without psychotic features ([place]) -     Discontinue: escitalopram (LEXAPRO) 20 MG tablet; Take 1 tablet (20 mg total) by mouth daily. -     escitalopram (LEXAPRO) 20 MG tablet; Take 1 tablet (20 mg total) by mouth daily.  Insomnia, unspecified type  Attention deficit hyperactivity disorder (ADHD), combined type -     Discontinue: amphetamine-dextroamphetamine (ADDERALL XR) 25 MG 24 hr capsule; Take 1 capsule by mouth every morning. -     amphetamine-dextroamphetamine (ADDERALL XR) 25 MG 24 hr capsule; Take 1 capsule by mouth every morning.     Please see After Visit Summary for patient specific instructions.  No future appointments.  No orders of the defined types were placed in this encounter.   -------------------------------

## 2019-02-28 MED ORDER — CLONAZEPAM 1 MG PO TABS
1.0000 mg | ORAL_TABLET | Freq: Every evening | ORAL | 0 refills | Status: DC | PRN
Start: 2019-02-28 — End: 2019-05-30

## 2019-02-28 MED ORDER — ESCITALOPRAM OXALATE 20 MG PO TABS: 20.0000 mg | ORAL_TABLET | Freq: Every day | ORAL | 0 refills | Status: DC

## 2019-02-28 MED ORDER — AMPHETAMINE-DEXTROAMPHET ER 25 MG PO CP24: 25.0000 mg | ORAL_CAPSULE | ORAL | 0 refills | Status: DC

## 2019-05-13 ENCOUNTER — Other Ambulatory Visit: Payer: Self-pay | Admitting: Adult Health

## 2019-05-13 DIAGNOSIS — F411 Generalized anxiety disorder: Secondary | ICD-10-CM

## 2019-05-13 DIAGNOSIS — F332 Major depressive disorder, recurrent severe without psychotic features: Secondary | ICD-10-CM

## 2019-05-30 ENCOUNTER — Other Ambulatory Visit: Payer: Self-pay | Admitting: Adult Health

## 2019-05-30 DIAGNOSIS — F411 Generalized anxiety disorder: Secondary | ICD-10-CM

## 2019-08-26 ENCOUNTER — Other Ambulatory Visit: Payer: Self-pay | Admitting: Adult Health

## 2019-08-26 DIAGNOSIS — F411 Generalized anxiety disorder: Secondary | ICD-10-CM

## 2019-09-05 ENCOUNTER — Ambulatory Visit: Payer: 59 | Admitting: Adult Health

## 2019-09-26 ENCOUNTER — Ambulatory Visit (INDEPENDENT_AMBULATORY_CARE_PROVIDER_SITE_OTHER): Payer: 59 | Admitting: Adult Health

## 2019-09-26 ENCOUNTER — Encounter: Payer: Self-pay | Admitting: Adult Health

## 2019-09-26 DIAGNOSIS — F332 Major depressive disorder, recurrent severe without psychotic features: Secondary | ICD-10-CM | POA: Diagnosis not present

## 2019-09-26 DIAGNOSIS — F411 Generalized anxiety disorder: Secondary | ICD-10-CM

## 2019-09-26 DIAGNOSIS — F902 Attention-deficit hyperactivity disorder, combined type: Secondary | ICD-10-CM

## 2019-09-26 DIAGNOSIS — G4700 Insomnia, unspecified: Secondary | ICD-10-CM

## 2019-09-26 MED ORDER — CLONAZEPAM 1 MG PO TABS: ORAL_TABLET | ORAL | 0 refills | Status: DC

## 2019-09-26 MED ORDER — AMPHETAMINE-DEXTROAMPHET ER 25 MG PO CP24: 25.0000 mg | ORAL_CAPSULE | ORAL | 0 refills | Status: DC

## 2019-09-26 MED ORDER — ESCITALOPRAM OXALATE 20 MG PO TABS: 20.0000 mg | ORAL_TABLET | Freq: Every day | ORAL | 3 refills | Status: DC

## 2019-09-26 NOTE — Progress Notes (Signed)
[name] [name] [id number] 04/21/1961 59 y.o.  Virtual Visit via Telephone Note  I connected with pt on 09/26/19 at  8:20 AM EST by telephone and verified that I am speaking with the correct person using two identifiers.   I discussed the limitations, risks, security and privacy concerns of performing an evaluation and management service by telephone and the availability of in person appointments. I also discussed with the patient that there may be a patient responsible charge related to this service. The patient expressed understanding and agreed to proceed.   I discussed the assessment and treatment plan with the patient. The patient was provided an opportunity to ask questions and all were answered. The patient agreed with the plan and demonstrated an understanding of the instructions.   The patient was advised to call back or seek an in-person evaluation if the symptoms worsen or if the condition fails to improve as anticipated.  I provided 30 minutes of non-face-to-face time during this encounter.  The patient was located at home.  The provider was located at [place].   Aloha Gell, NP   Subjective:   Patient ID:  [name] [name] is a 59 y.o. (DOB 1961-05-14) male.  Chief Complaint: No chief complaint on file.  HPI   [name] [name] presents for follow-up of anxiety, depression, insomnia, and ADHD. Describes mood today as "ok". Pleasant. Mood symptoms - denies depression, anxiety, and irritabilty. Stating "I'm doing ok right now". Working form home for past year. Concerns over Covid-19. Stable interest and motivation. Taking medications Energy levels stable. Active, has a regular exercise routine. Works full time. Enjoys some usual interests and activities. Lives with partner. Working from home. Getting out some. Appetite adequate. Weight stable. Sleeps well most nights with Clonazepam. Averages 6 to 8 hours. Focus and concentration stable. Completing tasks.  Managing aspects of household. Work going well.  Denies SI or HI. Denies AH or VH.  Review of Systems:  Review of Systems  Musculoskeletal: Negative for gait problem.  Neurological: Negative for tremors.  Psychiatric/Behavioral:       Please refer to HPI    Medications: I have reviewed the patient's current medications.  Current Outpatient Medications  Medication Sig Dispense Refill  . amphetamine-dextroamphetamine (ADDERALL XR) 25 MG 24 hr capsule Take 25 mg by mouth every morning. Only Monday- Friday    . amphetamine-dextroamphetamine (ADDERALL XR) 25 MG 24 hr capsule Take 1 capsule by mouth every morning. 90 capsule 0  . carvedilol (COREG) 12.5 MG tablet     . carvedilol (COREG) 6.25 MG tablet Take 6.25 mg by mouth 2 (two) times daily as needed for tremors.    . cetirizine (ZYRTEC) 5 MG tablet Take 5 mg by mouth daily as needed for allergies.     . clonazePAM (KLONOPIN) 1 MG tablet TAKE 1 TABLET AT BEDTIME AS NEEDED FOR ANXIETY SLEEP 90 tablet 0  . cyclobenzaprine (FLEXERIL) 10 MG tablet Take 10 mg by mouth 3 (three) times daily as needed for muscle spasms.     Marland Kitchen escitalopram (LEXAPRO) 20 MG tablet TAKE 1 TABLET DAILY 90 tablet 3  . gabapentin (NEURONTIN) 300 MG capsule     . HYDROcodone-acetaminophen (NORCO/VICODIN) 5-325 MG tablet Take 1 tablet by mouth every 6 (six) hours as needed for pain.    . nebivolol (BYSTOLIC) 5 MG tablet Take 5 mg by mouth daily.      . tadalafil (CIALIS) 5 MG tablet     . traZODone (DESYREL) 100 MG tablet Take 100  mg by mouth at bedtime as needed for sleep.    Marland Kitchen tretinoin (ALTRALIN) 0.05 % gel      No current facility-administered medications for this visit.    Medication Side Effects: None  Allergies: No Known Allergies  Past Medical History:  Diagnosis Date  . Allergy   . Anxiety   . Asthma   . 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 Depression   . 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  Social History    Socioeconomic History  . Marital status: Single    Spouse name: Not on file  . Number of children: Not on file  . Years of education: Not on file  . Highest education level: Not on file  Occupational History  . Not on file  Tobacco Use  . Smoking status: Never Smoker  . Smokeless tobacco: Never Used  Substance and Sexual Activity  . Alcohol use: Yes    Alcohol/week: 1.0 standard drinks    Types: 1 Glasses of wine per week  . Drug use: No  . Sexual activity: Not on file  Other Topics Concern  . Not on file  Social History Narrative  . Not on file   Social Determinants of Health   Financial Resource Strain:   . Difficulty of Paying Living Expenses:   Food Insecurity:   . Worried About Programme researcher, broadcasting/film/video in the Last Year:   . Barista in the Last Year:   Transportation Needs:   . Freight forwarder (Medical):   Marland Kitchen Lack of Transportation (Non-Medical):   Physical Activity:   . Days of Exercise per Week:   . Minutes of Exercise per Session:   Stress:   . Feeling of Stress :   Social Connections:   . Frequency of Communication with Friends and Family:   . Frequency of Social Gatherings with Friends and Family:   . Attends Religious Services:   . Active Member of Clubs or Organizations:   . Attends Banker Meetings:   Marland Kitchen Marital Status:   Intimate Partner Violence:   . Fear of Current or Ex-Partner:   . Emotionally Abused:   Marland Kitchen Physically Abused:   . Sexually Abused:     Past Medical History, Surgical history, Social history, and Family history were reviewed and updated as appropriate.   Please see review of systems for further details on the patient's review from today.   Objective:   Physical Exam:  There were no vitals taken for this visit.  Physical Exam Constitutional:      General: He is not in acute distress. Musculoskeletal:        General: No deformity.  Neurological:     Mental Status: He is alert and oriented to person,  place, and time.     Coordination: Coordination normal.  Psychiatric:        Attention and Perception: Attention and perception normal. He does not perceive auditory or visual hallucinations.        Mood and Affect: Mood normal. Mood is not anxious or depressed. Affect is not labile, blunt, angry or inappropriate.        Speech: Speech normal.        Behavior: Behavior normal.        Thought Content: Thought content normal. Thought content is not paranoid or delusional. Thought content does not include homicidal or suicidal ideation. Thought content does not include homicidal or suicidal plan.  Cognition and Memory: Cognition and memory normal.        Judgment: Judgment normal.     Comments: Insight intact     Lab Review:     Component Value Date/Time   NA 135 06/13/2016 1117   K 4.1 06/13/2016 1117   CL 98 (L) 06/13/2016 1117   CO2 28 06/13/2016 1117   GLUCOSE 102 (H) 06/13/2016 1117   BUN 18 06/13/2016 1117   CREATININE 1.01 06/13/2016 1117   CALCIUM 8.8 (L) 06/13/2016 1117   GFRNONAA >60 06/13/2016 1117   GFRAA >60 06/13/2016 1117       Component Value Date/Time   WBC 10.1 06/13/2016 1117   RBC 5.97 (H) 06/13/2016 1117   HGB 17.9 (H) 06/13/2016 1117   HGB 17.4 (H) 09/26/2011 1016   HCT 50.8 06/13/2016 1117   HCT 51.1 (H) 09/26/2011 1016   PLT 201 06/13/2016 1117   PLT 277 09/26/2011 1016   MCV 85.1 06/13/2016 1117   MCV 87.0 09/26/2011 1016   MCH 30.0 06/13/2016 1117   MCHC 35.2 06/13/2016 1117   RDW 12.5 06/13/2016 1117   RDW 12.6 09/26/2011 1016   LYMPHSABS 2.2 09/26/2011 1016   MONOABS 0.5 09/26/2011 1016   EOSABS 0.0 09/26/2011 1016   BASOSABS 0.1 09/26/2011 1016    No results found for: POCLITH, LITHIUM   No results found for: PHENYTOIN, PHENOBARB, VALPROATE, CBMZ   .res Assessment: Plan:    Plan: 1. Continue Clonazepam 2. Continue Adderall XR 25mg  daily 3. Continue Lexapro 20mg  tablet - take 1/2 tab daily for 7 days, then one tablet  daily.  RTC 3 months  Discussed potential benefits, risks, and side effects of stimulants with patient to include increased heart rate, palpitations, insomnia, increased anxiety, increased irritability, or decreased appetite.  Instructed patient to contact office if experiencing any significant tolerability issues.  Discussed potential benefits, risk, and side effects of benzodiazepines to include potential risk of tolerance and dependence, as well as possible drowsiness.  Advised patient not to drive if experiencing drowsiness and to take lowest possible effective dose to minimize risk of dependence and tolerance.  Patient advised to contact office with any questions, adverse effects, or acute worsening in signs and symptoms.   There are no diagnoses linked to this encounter.  Please see After Visit Summary for patient specific instructions.  Future Appointments  Date Time Provider Department Center  09/26/2019  8:20 AM Yevette Knust, , NP [facility] None    No orders of the defined types were placed in this encounter.     -------------------------------

## 2019-10-03 ENCOUNTER — Ambulatory Visit: Payer: Self-pay

## 2019-10-03 ENCOUNTER — Ambulatory Visit: Payer: 59 | Attending: Internal Medicine

## 2019-10-03 ENCOUNTER — Ambulatory Visit: Payer: Managed Care, Other (non HMO)

## 2019-10-03 DIAGNOSIS — Z23 Encounter for immunization: Secondary | ICD-10-CM

## 2019-10-03 NOTE — Progress Notes (Signed)
   Covid-19 Vaccination Clinic  Name:  Kohler Pellerito    MRN: [id number] DOB: [birth year]  10/03/2019  Mr. Keatts was observed post Covid-19 immunization for 15 minutes without incident. He was provided with Vaccine Information Sheet and instruction to access the V-Safe system.   Mr. Stracener was instructed to call 911 with any severe reactions post vaccine: Marland Kitchen Difficulty breathing  . Swelling of face and throat  . A fast heartbeat  . A bad rash all over body  . Dizziness and weakness   Immunizations Administered    Name Date Dose VIS Date Route   Pfizer COVID-19 Vaccine 10/03/2019  3:38 PM 0.3 mL 06/28/2019 Intramuscular   Manufacturer: [employer], [employer]   Lot: [id number]   NDC: [address]-7

## 2019-10-23 ENCOUNTER — Ambulatory Visit: Payer: 59 | Attending: Internal Medicine

## 2019-10-23 DIAGNOSIS — Z23 Encounter for immunization: Secondary | ICD-10-CM

## 2019-10-23 NOTE — Progress Notes (Signed)
   Covid-19 Vaccination Clinic  Name:  [name] [name]    MRN: [id number] DOB: 07/29/1960  10/23/2019  [name] was observed post Covid-19 immunization for 15 minutes without incident. He was provided with Vaccine Information Sheet and instruction to access the V-Safe system.   [name] was instructed to call 911 with any severe reactions post vaccine: Marland Kitchen Difficulty breathing  . Swelling of face and throat  . A fast heartbeat  . A bad rash all over body  . Dizziness and weakness   Immunizations Administered    Name Date Dose VIS Date Route   Pfizer COVID-19 Vaccine 10/23/2019  8:19 AM 0.3 mL 06/28/2019 Intramuscular   Manufacturer: [employer], [employer]   Lot: [id number]   NDC: [address]-9

## 2019-10-28 ENCOUNTER — Ambulatory Visit: Payer: 59

## 2019-11-14 ENCOUNTER — Other Ambulatory Visit: Payer: Self-pay | Admitting: Adult Health

## 2019-11-14 DIAGNOSIS — F411 Generalized anxiety disorder: Secondary | ICD-10-CM

## 2020-01-30 ENCOUNTER — Other Ambulatory Visit: Payer: Self-pay | Admitting: Adult Health

## 2020-01-30 DIAGNOSIS — F411 Generalized anxiety disorder: Secondary | ICD-10-CM

## 2020-01-30 MED ORDER — CLONAZEPAM 1 MG PO TABS: ORAL_TABLET | ORAL | 0 refills | Status: DC

## 2020-02-25 ENCOUNTER — Telehealth (INDEPENDENT_AMBULATORY_CARE_PROVIDER_SITE_OTHER): Payer: 59 | Admitting: Adult Health

## 2020-02-25 ENCOUNTER — Encounter: Payer: Self-pay | Admitting: Adult Health

## 2020-02-25 DIAGNOSIS — F902 Attention-deficit hyperactivity disorder, combined type: Secondary | ICD-10-CM | POA: Diagnosis not present

## 2020-02-25 DIAGNOSIS — F411 Generalized anxiety disorder: Secondary | ICD-10-CM | POA: Diagnosis not present

## 2020-02-25 DIAGNOSIS — F332 Major depressive disorder, recurrent severe without psychotic features: Secondary | ICD-10-CM | POA: Diagnosis not present

## 2020-02-25 DIAGNOSIS — G4700 Insomnia, unspecified: Secondary | ICD-10-CM | POA: Diagnosis not present

## 2020-02-25 MED ORDER — CLONAZEPAM 1 MG PO TABS: ORAL_TABLET | ORAL | 0 refills | Status: DC

## 2020-02-25 MED ORDER — AMPHETAMINE-DEXTROAMPHET ER 25 MG PO CP24: 25.0000 mg | ORAL_CAPSULE | ORAL | 0 refills | Status: DC

## 2020-02-25 MED ORDER — ESCITALOPRAM OXALATE 20 MG PO TABS
20.0000 mg | ORAL_TABLET | Freq: Every day | ORAL | 3 refills | Status: DC
Start: 2020-02-25 — End: 2020-11-24

## 2020-02-25 NOTE — Progress Notes (Signed)
[name] [name] [id number] February 15, 1961 59 y.o.  Subjective:   Patient ID:  [name] [name] is a 59 y.o. (DOB 1961/03/16) male.  Chief Complaint: No chief complaint on file.   HPI [name] [name] presents to the office today for follow-up of GAD, MDD, Insomnia, ADHD.  Describes mood today as "ok". Pleasant. Mood symptoms - denies depression, anxiety, and irritabilty. Stating "I'm doing alright". Feels like he is finding a better "balance" with work and life during [place]. Continues to work from home. Seeing therapist - Baltazar Apo. Stable interest and motivation. Taking medications Energy levels stable. Active, has a regular exercise routine. Works full time. Enjoys some usual interests and activities. Lives with partner. Working from home. Extended family. Getting out more. Appetite adequate. Weight stable. Sleeps well most nights with Clonazepam. Averages 6 to 8 hours. Focus and concentration stable. Completing tasks. Managing aspects of household. Work going well.  Denies SI or HI. Denies AH or VH.   Review of Systems:  Review of Systems  Musculoskeletal: Negative for gait problem.  Neurological: Negative for tremors.  Psychiatric/Behavioral:       Please refer to HPI    Medications: I have reviewed the patient's current medications.  Current Outpatient Medications  Medication Sig Dispense Refill  . amphetamine-dextroamphetamine (ADDERALL XR) 25 MG 24 hr capsule Take 25 mg by mouth every morning. Only Monday- Friday    . amphetamine-dextroamphetamine (ADDERALL XR) 25 MG 24 hr capsule Take 1 capsule by mouth every morning. 30 capsule 0  . [START ON 03/24/2020] amphetamine-dextroamphetamine (ADDERALL XR) 25 MG 24 hr capsule Take 1 capsule by mouth every morning. 90 capsule 0  . carvedilol (COREG) 12.5 MG tablet     . carvedilol (COREG) 6.25 MG tablet Take 6.25 mg by mouth 2 (two) times daily as needed for tremors.    . cetirizine (ZYRTEC) 5 MG tablet Take 5 mg by mouth daily as  needed for allergies.     . clonazePAM (KLONOPIN) 1 MG tablet TAKE ONE TABLET AT BEDTIME AS NEEDED FOR ANXIETY (SLEEP) 90 tablet 0  . cyclobenzaprine (FLEXERIL) 10 MG tablet Take 10 mg by mouth 3 (three) times daily as needed for muscle spasms.     Marland Kitchen escitalopram (LEXAPRO) 20 MG tablet Take 1 tablet (20 mg total) by mouth daily. 90 tablet 3  . gabapentin (NEURONTIN) 300 MG capsule     . HYDROcodone-acetaminophen (NORCO/VICODIN) 5-325 MG tablet Take 1 tablet by mouth every 6 (six) hours as needed for pain.    . nebivolol (BYSTOLIC) 5 MG tablet Take 5 mg by mouth daily.      . tadalafil (CIALIS) 5 MG tablet     . traZODone (DESYREL) 100 MG tablet Take 100 mg by mouth at bedtime as needed for sleep.    Marland Kitchen tretinoin (ALTRALIN) 0.05 % gel      No current facility-administered medications for this visit.    Medication Side Effects: None  Allergies: No Known Allergies  Past Medical History:  Diagnosis Date  . Allergy   . Anxiety   . Asthma   . 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 Depression   . 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  Social History   Socioeconomic History  . Marital status: Single    Spouse name: Not on file  . Number of children: Not on file  . Years of education: Not on file  . Highest education level: Not on file  Occupational History  . Not on file  Tobacco  Use  . Smoking status: Never Smoker  . Smokeless tobacco: Never Used  Substance and Sexual Activity  . Alcohol use: Yes    Alcohol/week: 1.0 standard drink    Types: 1 Glasses of wine per week  . Drug use: No  . Sexual activity: Not on file  Other Topics Concern  . Not on file  Social History Narrative  . Not on file   Social Determinants of Health   Financial Resource Strain:   . Difficulty of Paying Living Expenses:   Food Insecurity:   . Worried About Programme researcher, broadcasting/film/video in the Last Year:   . Barista in the Last Year:   Transportation Needs:   . Automotive engineer (Medical):   Marland Kitchen Lack of Transportation (Non-Medical):   Physical Activity:   . Days of Exercise per Week:   . Minutes of Exercise per Session:   Stress:   . Feeling of Stress :   Social Connections:   . Frequency of Communication with Friends and Family:   . Frequency of Social Gatherings with Friends and Family:   . Attends Religious Services:   . Active Member of Clubs or Organizations:   . Attends Banker Meetings:   Marland Kitchen Marital Status:   Intimate Partner Violence:   . Fear of Current or Ex-Partner:   . Emotionally Abused:   Marland Kitchen Physically Abused:   . Sexually Abused:     Past Medical History, Surgical history, Social history, and Family history were reviewed and updated as appropriate.   Please see review of systems for further details on the patient's review from today.   Objective:   Physical Exam:  There were no vitals taken for this visit.  Physical Exam Constitutional:      General: He is not in acute distress. Musculoskeletal:        General: No deformity.  Neurological:     Mental Status: He is alert and oriented to person, place, and time.     Coordination: Coordination normal.  Psychiatric:        Attention and Perception: Attention and perception normal. He does not perceive auditory or visual hallucinations.        Mood and Affect: Mood normal. Mood is not anxious or depressed. Affect is not labile, blunt, angry or inappropriate.        Speech: Speech normal.        Behavior: Behavior normal.        Thought Content: Thought content normal. Thought content is not paranoid or delusional. Thought content does not include homicidal or suicidal ideation. Thought content does not include homicidal or suicidal plan.        Cognition and Memory: Cognition and memory normal.        Judgment: Judgment normal.     Comments: Insight intact     Lab Review:     Component Value Date/Time   NA 135 06/13/2016 1117   K 4.1 06/13/2016 1117    CL 98 (L) 06/13/2016 1117   CO2 28 06/13/2016 1117   GLUCOSE 102 (H) 06/13/2016 1117   BUN 18 06/13/2016 1117   CREATININE 1.01 06/13/2016 1117   CALCIUM 8.8 (L) 06/13/2016 1117   GFRNONAA >60 06/13/2016 1117   GFRAA >60 06/13/2016 1117       Component Value Date/Time   WBC 10.1 06/13/2016 1117   RBC 5.97 (H) 06/13/2016 1117   HGB 17.9 (H) 06/13/2016 1117   HGB 17.4 (H)  09/26/2011 1016   HCT 50.8 06/13/2016 1117   HCT 51.1 (H) 09/26/2011 1016   PLT 201 06/13/2016 1117   PLT 277 09/26/2011 1016   MCV 85.1 06/13/2016 1117   MCV 87.0 09/26/2011 1016   MCH 30.0 06/13/2016 1117   MCHC 35.2 06/13/2016 1117   RDW 12.5 06/13/2016 1117   RDW 12.6 09/26/2011 1016   LYMPHSABS 2.2 09/26/2011 1016   MONOABS 0.5 09/26/2011 1016   EOSABS 0.0 09/26/2011 1016   BASOSABS 0.1 09/26/2011 1016    No results found for: POCLITH, LITHIUM   No results found for: PHENYTOIN, PHENOBARB, VALPROATE, CBMZ   .res Assessment: Plan:    Plan:  1. Continue Clonazepam 1mg  at bedtime 2. Continue Adderall XR 25mg  daily 3. Continue Lexapro 20mg  tablet  RTC 3 months  Discussed potential benefits, risks, and side effects of stimulants with patient to include increased heart rate, palpitations, insomnia, increased anxiety, increased irritability, or decreased appetite.  Instructed patient to contact office if experiencing any significant tolerability issues.  Discussed potential benefits, risk, and side effects of benzodiazepines to include potential risk of tolerance and dependence, as well as possible drowsiness.  Advised patient not to drive if experiencing drowsiness and to take lowest possible effective dose to minimize risk of dependence and tolerance.  Patient advised to contact office with any questions, adverse effects, or acute worsening in signs and symptoms.   Diagnoses and all orders for this visit:  Insomnia, unspecified type  Severe episode of recurrent major depressive disorder,  without psychotic features (HCC) -     escitalopram (LEXAPRO) 20 MG tablet; Take 1 tablet (20 mg total) by mouth daily.  Attention deficit hyperactivity disorder (ADHD), combined type -     amphetamine-dextroamphetamine (ADDERALL XR) 25 MG 24 hr capsule; Take 1 capsule by mouth every morning.  Generalized anxiety disorder -     escitalopram (LEXAPRO) 20 MG tablet; Take 1 tablet (20 mg total) by mouth daily. -     clonazePAM (KLONOPIN) 1 MG tablet; TAKE ONE TABLET AT BEDTIME AS NEEDED FOR ANXIETY (SLEEP)     Please see After Visit Summary for patient specific instructions.  No future appointments.  No orders of the defined types were placed in this encounter.   -------------------------------

## 2020-04-13 ENCOUNTER — Other Ambulatory Visit: Payer: Self-pay | Admitting: Adult Health

## 2020-04-13 DIAGNOSIS — F411 Generalized anxiety disorder: Secondary | ICD-10-CM

## 2020-06-26 ENCOUNTER — Telehealth (INDEPENDENT_AMBULATORY_CARE_PROVIDER_SITE_OTHER): Payer: 59 | Admitting: Adult Health

## 2020-06-26 ENCOUNTER — Encounter: Payer: Self-pay | Admitting: Adult Health

## 2020-06-26 DIAGNOSIS — F331 Major depressive disorder, recurrent, moderate: Secondary | ICD-10-CM | POA: Diagnosis not present

## 2020-06-26 DIAGNOSIS — F902 Attention-deficit hyperactivity disorder, combined type: Secondary | ICD-10-CM

## 2020-06-26 DIAGNOSIS — G4700 Insomnia, unspecified: Secondary | ICD-10-CM

## 2020-06-26 DIAGNOSIS — F411 Generalized anxiety disorder: Secondary | ICD-10-CM

## 2020-06-26 MED ORDER — CLONAZEPAM 1 MG PO TABS
ORAL_TABLET | ORAL | 0 refills | Status: DC
Start: 2020-06-26 — End: 2020-08-05

## 2020-06-26 MED ORDER — FLUOXETINE HCL 20 MG PO CAPS: 20.0000 mg | ORAL_CAPSULE | Freq: Two times a day (BID) | ORAL | 3 refills | Status: DC

## 2020-06-26 MED ORDER — AMPHETAMINE-DEXTROAMPHET ER 25 MG PO CP24: 25.0000 mg | ORAL_CAPSULE | ORAL | 0 refills | Status: DC

## 2020-06-26 NOTE — Progress Notes (Signed)
[name] [name] [id number] 1960-11-20 59 y.o.  Subjective:   Patient ID:  [name] [name] is a 59 y.o. (DOB 11-Apr-1961) male.  Chief Complaint: No chief complaint on file.   HPI [name] [name] presents to the office today for follow-up of GAD, MDD, Insomnia, ADHD.  Describes mood today as "ok". Pleasant. Mood symptoms - reports periods of depression, anxiety, and irritabilty. Stating "I'm doing alright". Would like to switch from Lexapro to Prozac. Does not feel like the Lexapro is working as well as it did. Continues to work from home. Seeing therapist - [name] [name]. Stable interest and motivation. Taking medications as prescribed.  Energy levels stable. Active, has a regular exercise routine. Enjoys some usual interests and activities. Lives with partner. Working from home. Extended family. Getting out more. Appetite adequate. Weight stable. Sleeps well most nights with Clonazepam. Averages 6 to 8 hours. Focus and concentration stable. Completing tasks. Managing aspects of household. Work going well.  Denies SI or HI. Denies AH or VH.  Review of Systems:  Review of Systems  Musculoskeletal: Negative for gait problem.  Neurological: Negative for tremors.  Psychiatric/Behavioral:       Please refer to HPI    Medications: I have reviewed the patient's current medications.  Current Outpatient Medications  Medication Sig Dispense Refill  . amphetamine-dextroamphetamine (ADDERALL XR) 25 MG 24 hr capsule Take 25 mg by mouth every morning. Only Monday- Friday    . amphetamine-dextroamphetamine (ADDERALL XR) 25 MG 24 hr capsule Take 1 capsule by mouth every morning. 30 capsule 0  . amphetamine-dextroamphetamine (ADDERALL XR) 25 MG 24 hr capsule Take 1 capsule by mouth every morning. 90 capsule 0  . carvedilol (COREG) 12.5 MG tablet     . carvedilol (COREG) 6.25 MG tablet Take 6.25 mg by mouth 2 (two) times daily as needed for tremors.    . cetirizine (ZYRTEC) 5 MG tablet Take 5  mg by mouth daily as needed for allergies.     . clonazePAM (KLONOPIN) 1 MG tablet TAKE 1 TABLET AT BEDTIME AS NEEDED FOR ANXIETY SLEEP 90 tablet 0  . cyclobenzaprine (FLEXERIL) 10 MG tablet Take 10 mg by mouth 3 (three) times daily as needed for muscle spasms.     Marland Kitchen escitalopram (LEXAPRO) 20 MG tablet Take 1 tablet (20 mg total) by mouth daily. 90 tablet 3  . FLUoxetine (PROZAC) 20 MG capsule Take 1 capsule (20 mg total) by mouth 2 (two) times daily. 180 capsule 3  . gabapentin (NEURONTIN) 300 MG capsule     . HYDROcodone-acetaminophen (NORCO/VICODIN) 5-325 MG tablet Take 1 tablet by mouth every 6 (six) hours as needed for pain.    . nebivolol (BYSTOLIC) 5 MG tablet Take 5 mg by mouth daily.      . tadalafil (CIALIS) 5 MG tablet     . traZODone (DESYREL) 100 MG tablet Take 100 mg by mouth at bedtime as needed for sleep.    Marland Kitchen tretinoin (ALTRALIN) 0.05 % gel      No current facility-administered medications for this visit.    Medication Side Effects: None  Allergies: No Known Allergies  Past Medical History:  Diagnosis Date  . Allergy   . Anxiety   . Asthma   . 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 Depression   . 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  Social History   Socioeconomic History  . Marital status: Single    Spouse name: Not on file  . Number of children: Not on  file  . Years of education: Not on file  . Highest education level: Not on file  Occupational History  . Not on file  Tobacco Use  . Smoking status: Never Smoker  . Smokeless tobacco: Never Used  Substance and Sexual Activity  . Alcohol use: Yes    Alcohol/week: 1.0 standard drink    Types: 1 Glasses of wine per week  . Drug use: No  . Sexual activity: Not on file  Other Topics Concern  . Not on file  Social History Narrative  . Not on file   Social Determinants of Health   Financial Resource Strain: Not on file  Food Insecurity: Not on file  Transportation Needs: Not  on file  Physical Activity: Not on file  Stress: Not on file  Social Connections: Not on file  Intimate Partner Violence: Not on file    Past Medical History, Surgical history, Social history, and Family history were reviewed and updated as appropriate.   Please see review of systems for further details on the patient's review from today.   Objective:   Physical Exam:  There were no vitals taken for this visit.  Physical Exam Constitutional:      General: He is not in acute distress. Musculoskeletal:        General: No deformity.  Neurological:     Mental Status: He is alert and oriented to person, place, and time.     Coordination: Coordination normal.  Psychiatric:        Attention and Perception: Attention and perception normal. He does not perceive auditory or visual hallucinations.        Mood and Affect: Mood normal. Mood is not anxious or depressed. Affect is not labile, blunt, angry or inappropriate.        Speech: Speech normal.        Behavior: Behavior normal.        Thought Content: Thought content normal. Thought content is not paranoid or delusional. Thought content does not include homicidal or suicidal ideation. Thought content does not include homicidal or suicidal plan.        Cognition and Memory: Cognition and memory normal.        Judgment: Judgment normal.     Comments: Insight intact     Lab Review:     Component Value Date/Time   NA 135 06/13/2016 1117   K 4.1 06/13/2016 1117   CL 98 (L) 06/13/2016 1117   CO2 28 06/13/2016 1117   GLUCOSE 102 (H) 06/13/2016 1117   BUN 18 06/13/2016 1117   CREATININE 1.01 06/13/2016 1117   CALCIUM 8.8 (L) 06/13/2016 1117   GFRNONAA >60 06/13/2016 1117   GFRAA >60 06/13/2016 1117       Component Value Date/Time   WBC 10.1 06/13/2016 1117   RBC 5.97 (H) 06/13/2016 1117   HGB 17.9 (H) 06/13/2016 1117   HGB 17.4 (H) 09/26/2011 1016   HCT 50.8 06/13/2016 1117   HCT 51.1 (H) 09/26/2011 1016   PLT 201  06/13/2016 1117   PLT 277 09/26/2011 1016   MCV 85.1 06/13/2016 1117   MCV 87.0 09/26/2011 1016   MCH 30.0 06/13/2016 1117   MCHC 35.2 06/13/2016 1117   RDW 12.5 06/13/2016 1117   RDW 12.6 09/26/2011 1016   LYMPHSABS 2.2 09/26/2011 1016   MONOABS 0.5 09/26/2011 1016   EOSABS 0.0 09/26/2011 1016   BASOSABS 0.1 09/26/2011 1016    No results found for: POCLITH, LITHIUM   No results  found for: PHENYTOIN, PHENOBARB, VALPROATE, CBMZ   .res Assessment: Plan:    Plan:  1. Continue Clonazepam 1mg  at bedtime 2. Continue Adderall XR 25mg  daily 3. Discontinue Lexapro 20mg  tablet 4. Add Prozac 20mg  BID   RTC 3 months  Discussed potential benefits, risks, and side effects of stimulants with patient to include increased heart rate, palpitations, insomnia, increased anxiety, increased irritability, or decreased appetite.  Instructed patient to contact office if experiencing any significant tolerability issues.  Discussed potential benefits, risk, and side effects of benzodiazepines to include potential risk of tolerance and dependence, as well as possible drowsiness.  Advised patient not to drive if experiencing drowsiness and to take lowest possible effective dose to minimize risk of dependence and tolerance.  Patient advised to contact office with any questions, adverse effects, or acute worsening in signs and symptoms.    Diagnoses and all orders for this visit:  Attention deficit hyperactivity disorder (ADHD), combined type -     amphetamine-dextroamphetamine (ADDERALL XR) 25 MG 24 hr capsule; Take 1 capsule by mouth every morning.  Insomnia, unspecified type -     clonazePAM (KLONOPIN) 1 MG tablet; TAKE 1 TABLET AT BEDTIME AS NEEDED FOR ANXIETY SLEEP  Generalized anxiety disorder -     clonazePAM (KLONOPIN) 1 MG tablet; TAKE 1 TABLET AT BEDTIME AS NEEDED FOR ANXIETY SLEEP -     FLUoxetine (PROZAC) 20 MG capsule; Take 1 capsule (20 mg total) by mouth 2 (two) times daily.  Major  depressive disorder, recurrent episode, moderate (HCC) -     FLUoxetine (PROZAC) 20 MG capsule; Take 1 capsule (20 mg total) by mouth 2 (two) times daily.     Please see After Visit Summary for patient specific instructions.  No future appointments.  No orders of the defined types were placed in this encounter.   -------------------------------

## 2020-08-05 ENCOUNTER — Other Ambulatory Visit: Payer: Self-pay | Admitting: Adult Health

## 2020-08-05 DIAGNOSIS — F411 Generalized anxiety disorder: Secondary | ICD-10-CM

## 2020-08-05 DIAGNOSIS — G4700 Insomnia, unspecified: Secondary | ICD-10-CM

## 2020-10-30 ENCOUNTER — Other Ambulatory Visit: Payer: Self-pay | Admitting: Adult Health

## 2020-10-30 DIAGNOSIS — G4700 Insomnia, unspecified: Secondary | ICD-10-CM

## 2020-10-30 DIAGNOSIS — F411 Generalized anxiety disorder: Secondary | ICD-10-CM

## 2020-11-23 ENCOUNTER — Other Ambulatory Visit: Payer: Self-pay

## 2020-11-23 ENCOUNTER — Telehealth: Payer: Self-pay | Admitting: Adult Health

## 2020-11-23 MED ORDER — AMPHETAMINE-DEXTROAMPHET ER 25 MG PO CP24: 25.0000 mg | ORAL_CAPSULE | ORAL | 0 refills | Status: DC

## 2020-11-23 NOTE — Telephone Encounter (Signed)
pended

## 2020-11-23 NOTE — Telephone Encounter (Signed)
Script sent  

## 2020-11-23 NOTE — Telephone Encounter (Signed)
Pt called and has an appt tomorrow. He needs a refill on his adderall xr 25 mg to [facility]

## 2020-11-24 ENCOUNTER — Encounter: Payer: Self-pay | Admitting: Adult Health

## 2020-11-24 ENCOUNTER — Telehealth (INDEPENDENT_AMBULATORY_CARE_PROVIDER_SITE_OTHER): Payer: 59 | Admitting: Adult Health

## 2020-11-24 DIAGNOSIS — F902 Attention-deficit hyperactivity disorder, combined type: Secondary | ICD-10-CM | POA: Diagnosis not present

## 2020-11-24 DIAGNOSIS — F331 Major depressive disorder, recurrent, moderate: Secondary | ICD-10-CM

## 2020-11-24 DIAGNOSIS — F411 Generalized anxiety disorder: Secondary | ICD-10-CM | POA: Diagnosis not present

## 2020-11-24 DIAGNOSIS — G4700 Insomnia, unspecified: Secondary | ICD-10-CM

## 2020-11-24 MED ORDER — AMPHETAMINE-DEXTROAMPHET ER 25 MG PO CP24: 25.0000 mg | ORAL_CAPSULE | ORAL | 0 refills | Status: DC

## 2020-11-24 MED ORDER — CLONAZEPAM 1 MG PO TABS: ORAL_TABLET | ORAL | 0 refills | Status: DC

## 2020-11-24 MED ORDER — AMPHETAMINE-DEXTROAMPHET ER 25 MG PO CP24
25.0000 mg | ORAL_CAPSULE | ORAL | 0 refills | Status: DC
Start: 2020-11-24 — End: 2022-10-18

## 2020-11-24 MED ORDER — FLUOXETINE HCL 20 MG PO CAPS: 20.0000 mg | ORAL_CAPSULE | Freq: Two times a day (BID) | ORAL | 3 refills | Status: DC

## 2020-11-24 NOTE — Progress Notes (Signed)
[name] [name] [id number] 06/23/1961 60 y.o.  Virtual Visit via Telephone Note  I connected with pt on 11/24/20 at  8:40 AM EDT by telephone and verified that I am speaking with the correct person using two identifiers.   I discussed the limitations, risks, security and privacy concerns of performing an evaluation and management service by telephone and the availability of in person appointments. I also discussed with the patient that there may be a patient responsible charge related to this service. The patient expressed understanding and agreed to proceed.   I discussed the assessment and treatment plan with the patient. The patient was provided an opportunity to ask questions and all were answered. The patient agreed with the plan and demonstrated an understanding of the instructions.   The patient was advised to call back or seek an in-person evaluation if the symptoms worsen or if the condition fails to improve as anticipated.  I provided 30 minutes of non-face-to-face time during this encounter.  The patient was located at home.  The provider was located at [facility].   Dorothyann Gibbs, NP   Subjective:   Patient ID:  [name] [name] is a 60 y.o. (DOB [birth year]) male.  Chief Complaint: No chief complaint on file.   HPI [name] [name] presents for follow-up of GAD, MDD, Insomnia, ADHD.  Describes mood today as "ok". Pleasant. Mood symptoms - reports periods of depression, anxiety, and irritabilty. Stating "I'm doing ok". Getting out more - going out to eat. Working with Psychologist, educational. Feels like medications are working well. Seeing therapist - Baltazar Apo. Stable interest and motivation. Taking medications as prescribed.  Energy levels stable. Active, has a regular exercise routine. Enjoys some usual interests and activities. Lives with partner. Extended family. Appetite adequate. Weight stable. Sleeps well most nights - using Clonazepam. Averages 6 to 8  hours. Focus and concentration stable. Completing tasks. Managing aspects of household. Work going well - remote position. Denies SI or HI.  Denies AH or VH.   Review of Systems:  Review of Systems  Musculoskeletal: Negative for gait problem.  Neurological: Negative for tremors.  Psychiatric/Behavioral:       Please refer to HPI    Medications: I have reviewed the patient's current medications.  Current Outpatient Medications  Medication Sig Dispense Refill  . amphetamine-dextroamphetamine (ADDERALL XR) 25 MG 24 hr capsule Take 1 capsule by mouth every morning. 30 capsule 0  . amphetamine-dextroamphetamine (ADDERALL XR) 25 MG 24 hr capsule Take 1 capsule by mouth every morning. 30 capsule 0  . amphetamine-dextroamphetamine (ADDERALL XR) 25 MG 24 hr capsule Take 1 capsule by mouth every morning. 90 capsule 0  . carvedilol (COREG) 12.5 MG tablet     . carvedilol (COREG) 6.25 MG tablet Take 6.25 mg by mouth 2 (two) times daily as needed for tremors.    . cetirizine (ZYRTEC) 5 MG tablet Take 5 mg by mouth daily as needed for allergies.     . clonazePAM (KLONOPIN) 1 MG tablet Take one tablet daily. 90 tablet 0  . cyclobenzaprine (FLEXERIL) 10 MG tablet Take 10 mg by mouth 3 (three) times daily as needed for muscle spasms.     Marland Kitchen FLUoxetine (PROZAC) 20 MG capsule Take 1 capsule (20 mg total) by mouth 2 (two) times daily. 180 capsule 3  . gabapentin (NEURONTIN) 300 MG capsule     . HYDROcodone-acetaminophen (NORCO/VICODIN) 5-325 MG tablet Take 1 tablet by mouth every 6 (six) hours as needed for pain.    . nebivolol (  BYSTOLIC) 5 MG tablet Take 5 mg by mouth daily.      . tadalafil (CIALIS) 5 MG tablet     . traZODone (DESYREL) 100 MG tablet Take 100 mg by mouth at bedtime as needed for sleep.    Marland Kitchen tretinoin (ALTRALIN) 0.05 % gel      No current facility-administered medications for this visit.    Medication Side Effects: None  Allergies:  Allergies  Allergen Reactions  .  Telithromycin     Other reaction(s): Unknown    Past Medical History:  Diagnosis Date  . Allergy   . Anxiety   . Asthma   . 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 Depression   . 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  Social History   Socioeconomic History  . Marital status: Single    Spouse name: Not on file  . Number of children: Not on file  . Years of education: Not on file  . Highest education level: Not on file  Occupational History  . Not on file  Tobacco Use  . Smoking status: Never Smoker  . Smokeless tobacco: Never Used  Substance and Sexual Activity  . Alcohol use: Yes    Alcohol/week: 1.0 standard drink    Types: 1 Glasses of wine per week  . Drug use: No  . Sexual activity: Not on file  Other Topics Concern  . Not on file  Social History Narrative  . Not on file   Social Determinants of Health   Financial Resource Strain: Not on file  Food Insecurity: Not on file  Transportation Needs: Not on file  Physical Activity: Not on file  Stress: Not on file  Social Connections: Not on file  Intimate Partner Violence: Not on file    Past Medical History, Surgical history, Social history, and Family history were reviewed and updated as appropriate.   Please see review of systems for further details on the patient's review from today.   Objective:   Physical Exam:  There were no vitals taken for this visit.  Physical Exam Constitutional:      General: He is not in acute distress. Musculoskeletal:        General: No deformity.  Neurological:     Mental Status: He is alert and oriented to person, place, and time.     Coordination: Coordination normal.  Psychiatric:        Attention and Perception: Attention and perception normal. He does not perceive auditory or visual hallucinations.        Mood and Affect: Mood normal. Mood is not anxious or depressed. Affect is not labile, blunt, angry or inappropriate.        Speech:  Speech normal.        Behavior: Behavior normal.        Thought Content: Thought content normal. Thought content is not paranoid or delusional. Thought content does not include homicidal or suicidal ideation. Thought content does not include homicidal or suicidal plan.        Cognition and Memory: Cognition and memory normal.        Judgment: Judgment normal.     Comments: Insight intact     Lab Review:     Component Value Date/Time   NA 135 06/13/2016 1117   K 4.1 06/13/2016 1117   CL 98 (L) 06/13/2016 1117   CO2 28 06/13/2016 1117   GLUCOSE 102 (H) 06/13/2016 1117   BUN 18 06/13/2016 1117  CREATININE 1.01 06/13/2016 1117   CALCIUM 8.8 (L) 06/13/2016 1117   GFRNONAA >60 06/13/2016 1117   GFRAA >60 06/13/2016 1117       Component Value Date/Time   WBC 10.1 06/13/2016 1117   RBC 5.97 (H) 06/13/2016 1117   HGB 17.9 (H) 06/13/2016 1117   HGB 17.4 (H) 09/26/2011 1016   HCT 50.8 06/13/2016 1117   HCT 51.1 (H) 09/26/2011 1016   PLT 201 06/13/2016 1117   PLT 277 09/26/2011 1016   MCV 85.1 06/13/2016 1117   MCV 87.0 09/26/2011 1016   MCH 30.0 06/13/2016 1117   MCHC 35.2 06/13/2016 1117   RDW 12.5 06/13/2016 1117   RDW 12.6 09/26/2011 1016   LYMPHSABS 2.2 09/26/2011 1016   MONOABS 0.5 09/26/2011 1016   EOSABS 0.0 09/26/2011 1016   BASOSABS 0.1 09/26/2011 1016    No results found for: POCLITH, LITHIUM   No results found for: PHENYTOIN, PHENOBARB, VALPROATE, CBMZ   .res Assessment: Plan:     Plan:  1. Continue Clonazepam 1mg  at bedtime 2. Continue Adderall XR 25mg  daily 3. Continue Prozac 20mg  BID   RTC 3 months  Discussed potential benefits, risks, and side effects of stimulants with patient to include increased heart rate, palpitations, insomnia, increased anxiety, increased irritability, or decreased appetite.  Instructed patient to contact office if experiencing any significant tolerability issues.  Discussed potential benefits, risk, and side effects of  benzodiazepines to include potential risk of tolerance and dependence, as well as possible drowsiness.  Advised patient not to drive if experiencing drowsiness and to take lowest possible effective dose to minimize risk of dependence and tolerance.  Patient advised to contact office with any questions, adverse effects, or acute worsening in signs and symptoms.    Diagnoses and all orders for this visit:  Attention deficit hyperactivity disorder (ADHD), combined type -     amphetamine-dextroamphetamine (ADDERALL XR) 25 MG 24 hr capsule; Take 1 capsule by mouth every morning. -     amphetamine-dextroamphetamine (ADDERALL XR) 25 MG 24 hr capsule; Take 1 capsule by mouth every morning.  Insomnia, unspecified type -     Discontinue: clonazePAM (KLONOPIN) 1 MG tablet; Take one tablet daily. -     clonazePAM (KLONOPIN) 1 MG tablet; Take one tablet daily.  Generalized anxiety disorder -     Discontinue: clonazePAM (KLONOPIN) 1 MG tablet; Take one tablet daily. -     FLUoxetine (PROZAC) 20 MG capsule; Take 1 capsule (20 mg total) by mouth 2 (two) times daily. -     clonazePAM (KLONOPIN) 1 MG tablet; Take one tablet daily.  Major depressive disorder, recurrent episode, moderate (HCC) -     FLUoxetine (PROZAC) 20 MG capsule; Take 1 capsule (20 mg total) by mouth 2 (two) times daily.    Please see After Visit Summary for patient specific instructions.  No future appointments.  No orders of the defined types were placed in this encounter.     -------------------------------

## 2020-11-24 NOTE — Addendum Note (Signed)
Addended by: Dorothyann Gibbs on: 11/24/2020 09:49 AM   Modules accepted: Level of Service

## 2021-01-21 ENCOUNTER — Encounter: Payer: Self-pay | Admitting: Internal Medicine

## 2021-03-19 ENCOUNTER — Other Ambulatory Visit: Payer: Self-pay | Admitting: Internal Medicine

## 2021-03-19 DIAGNOSIS — E785 Hyperlipidemia, unspecified: Secondary | ICD-10-CM

## 2021-04-23 ENCOUNTER — Other Ambulatory Visit: Payer: Self-pay | Admitting: Adult Health

## 2021-04-23 DIAGNOSIS — G4700 Insomnia, unspecified: Secondary | ICD-10-CM

## 2021-04-23 DIAGNOSIS — F411 Generalized anxiety disorder: Secondary | ICD-10-CM

## 2021-04-23 NOTE — Telephone Encounter (Signed)
Please schedule appt

## 2021-04-24 ENCOUNTER — Other Ambulatory Visit: Payer: Self-pay | Admitting: Adult Health

## 2021-04-24 DIAGNOSIS — G4700 Insomnia, unspecified: Secondary | ICD-10-CM

## 2021-04-24 DIAGNOSIS — F411 Generalized anxiety disorder: Secondary | ICD-10-CM

## 2021-04-26 NOTE — Telephone Encounter (Signed)
Please schedule appt

## 2021-04-27 NOTE — Telephone Encounter (Signed)
Last filled 5/10 appt on 10/17

## 2021-04-27 NOTE — Telephone Encounter (Signed)
Appt 10/17

## 2021-04-29 NOTE — Telephone Encounter (Signed)
Appt scheduled 10/17

## 2021-05-03 ENCOUNTER — Encounter: Payer: Self-pay | Admitting: Adult Health

## 2021-05-03 ENCOUNTER — Ambulatory Visit (INDEPENDENT_AMBULATORY_CARE_PROVIDER_SITE_OTHER): Payer: 59 | Admitting: Adult Health

## 2021-05-03 DIAGNOSIS — F902 Attention-deficit hyperactivity disorder, combined type: Secondary | ICD-10-CM

## 2021-05-03 DIAGNOSIS — G4700 Insomnia, unspecified: Secondary | ICD-10-CM

## 2021-05-03 DIAGNOSIS — F331 Major depressive disorder, recurrent, moderate: Secondary | ICD-10-CM

## 2021-05-03 DIAGNOSIS — F411 Generalized anxiety disorder: Secondary | ICD-10-CM | POA: Diagnosis not present

## 2021-05-03 MED ORDER — FLUOXETINE HCL 20 MG PO CAPS: 20.0000 mg | ORAL_CAPSULE | Freq: Two times a day (BID) | ORAL | 3 refills | Status: DC

## 2021-05-03 MED ORDER — CLONAZEPAM 1 MG PO TABS: 1.0000 mg | ORAL_TABLET | Freq: Every day | ORAL | 0 refills | Status: DC

## 2021-05-03 MED ORDER — AMPHETAMINE-DEXTROAMPHET ER 25 MG PO CP24: 25.0000 mg | ORAL_CAPSULE | ORAL | 0 refills | Status: DC

## 2021-05-03 NOTE — Progress Notes (Signed)
[name] [name] [id number] June 05, 1961 60 y.o.  Virtual Visit via Telephone Note  I connected with pt on 05/03/21 at  8:20 AM EDT by telephone and verified that I am speaking with the correct person using two identifiers.   I discussed the limitations, risks, security and privacy concerns of performing an evaluation and management service by telephone and the availability of in person appointments. I also discussed with the patient that there may be a patient responsible charge related to this service. The patient expressed understanding and agreed to proceed.   I discussed the assessment and treatment plan with the patient. The patient was provided an opportunity to ask questions and all were answered. The patient agreed with the plan and demonstrated an understanding of the instructions.   The patient was advised to call back or seek an in-person evaluation if the symptoms worsen or if the condition fails to improve as anticipated.  I provided 25 minutes of non-face-to-face time during this encounter.  The patient was located at home.  The provider was located at [facility].   Dorothyann Gibbs, NP   Subjective:   Patient ID:  [name] [name] is a 60 y.o. (DOB 1961/05/23) male.  Chief Complaint: No chief complaint on file.   HPI Antjuan Rothe presents for follow-up of GAD, MDD, Insomnia, ADHD.  Describes mood today as "ok". Pleasant. Mood symptoms - reports depression, anxiety, and irritability at times. Stating "I'm doing alright". Feels like medications continue to work well. Planning to retire in 6 months. Seeing therapist - Baltazar Apo. Stable interest and motivation. Taking medications as prescribed.  Energy levels stable. Active, has a regular exercise routine. Enjoys some usual interests and activities. Lives with partner. Spending tie with family and friends. Appetite adequate. Weight stable. Sleeps well most nights - using Clonazepam. Averages 6 to 8  hours. Focus and concentration stable. Completing tasks. Managing aspects of household. Work going well - remote position. Denies SI or HI.  Denies AH or VH.   Review of Systems:  Review of Systems  Musculoskeletal:  Negative for gait problem.  Neurological:  Negative for tremors.  Psychiatric/Behavioral:         Please refer to HPI   Medications: I have reviewed the patient's current medications.  Current Outpatient Medications  Medication Sig Dispense Refill   amphetamine-dextroamphetamine (ADDERALL XR) 25 MG 24 hr capsule Take 1 capsule by mouth every morning. 30 capsule 0   amphetamine-dextroamphetamine (ADDERALL XR) 25 MG 24 hr capsule Take 1 capsule by mouth every morning. 30 capsule 0   amphetamine-dextroamphetamine (ADDERALL XR) 25 MG 24 hr capsule Take 1 capsule by mouth every morning. 90 capsule 0   carvedilol (COREG) 12.5 MG tablet      carvedilol (COREG) 6.25 MG tablet Take 6.25 mg by mouth 2 (two) times daily as needed for tremors.     cetirizine (ZYRTEC) 5 MG tablet Take 5 mg by mouth daily as needed for allergies.      clonazePAM (KLONOPIN) 1 MG tablet Take 1 tablet (1 mg total) by mouth daily. 90 tablet 0   cyclobenzaprine (FLEXERIL) 10 MG tablet Take 10 mg by mouth 3 (three) times daily as needed for muscle spasms.      FLUoxetine (PROZAC) 20 MG capsule Take 1 capsule (20 mg total) by mouth 2 (two) times daily. 180 capsule 3   gabapentin (NEURONTIN) 300 MG capsule      HYDROcodone-acetaminophen (NORCO/VICODIN) 5-325 MG tablet Take 1 tablet by mouth every 6 (six) hours as  needed for pain.     nebivolol (BYSTOLIC) 5 MG tablet Take 5 mg by mouth daily.       tadalafil (CIALIS) 5 MG tablet      traZODone (DESYREL) 100 MG tablet Take 100 mg by mouth at bedtime as needed for sleep.     tretinoin (ALTRALIN) 0.05 % gel      No current facility-administered medications for this visit.    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  Social History   Socioeconomic History   Marital status: Single    Spouse name: Not on file   Number of children: Not on file   Years of education: Not on file   Highest education level: Not on file  Occupational History   Not on file  Tobacco Use   Smoking status: Never   Smokeless tobacco: Never  Substance and Sexual Activity   Alcohol use: Yes    Alcohol/week: 1.0 standard drink    Types: 1 Glasses of wine per week   Drug use: No   Sexual activity: Not on file  Other Topics Concern   Not on file  Social History Narrative   Not on file   Social Determinants of Health   Financial Resource Strain: Not on file  Food Insecurity: Not on file  Transportation Needs: Not on file  Physical Activity: Not on file  Stress: Not on file  Social Connections: Not on file  Intimate Partner Violence: Not on file    Past Medical History, Surgical history, Social history, and Family history were reviewed and updated as appropriate.   Please see review of systems for further details on the patient's review from today.   Objective:   Physical Exam:  There were no vitals taken for this visit.  Physical Exam Constitutional:      General: He is not in acute distress. Musculoskeletal:        General: No deformity.  Neurological:     Mental Status: He is alert and oriented to person, place, and time.     Coordination: Coordination normal.  Psychiatric:        Attention and Perception: Attention and perception normal. He does not perceive auditory or visual hallucinations.        Mood and Affect: Mood normal. Mood is not anxious or depressed. Affect is not labile, blunt, angry or inappropriate.        Speech: Speech normal.        Behavior:  Behavior normal.        Thought Content: Thought content normal. Thought content is not paranoid or delusional. Thought content does not include homicidal or suicidal ideation. Thought content does not include homicidal or suicidal plan.        Cognition and Memory: Cognition and memory normal.        Judgment: Judgment normal.     Comments: Insight intact    Lab Review:     Component Value Date/Time   NA 135 06/13/2016 1117   K 4.1 06/13/2016 1117   CL 98 (L) 06/13/2016 1117   CO2 28 06/13/2016 1117   GLUCOSE 102 (H) 06/13/2016 1117   BUN  18 06/13/2016 1117   CREATININE 1.01 06/13/2016 1117   CALCIUM 8.8 (L) 06/13/2016 1117   GFRNONAA >60 06/13/2016 1117   GFRAA >60 06/13/2016 1117       Component Value Date/Time   WBC 10.1 06/13/2016 1117   RBC 5.97 (H) 06/13/2016 1117   HGB 17.9 (H) 06/13/2016 1117   HGB 17.4 (H) 09/26/2011 1016   HCT 50.8 06/13/2016 1117   HCT 51.1 (H) 09/26/2011 1016   PLT 201 06/13/2016 1117   PLT 277 09/26/2011 1016   MCV 85.1 06/13/2016 1117   MCV 87.0 09/26/2011 1016   MCH 30.0 06/13/2016 1117   MCHC 35.2 06/13/2016 1117   RDW 12.5 06/13/2016 1117   RDW 12.6 09/26/2011 1016   LYMPHSABS 2.2 09/26/2011 1016   MONOABS 0.5 09/26/2011 1016   EOSABS 0.0 09/26/2011 1016   BASOSABS 0.1 09/26/2011 1016    No results found for: POCLITH, LITHIUM   No results found for: PHENYTOIN, PHENOBARB, VALPROATE, CBMZ   .res Assessment: Plan:    Plan:  1. Continue Clonazepam 1mg  at bedtime 2. Continue Adderall XR 25mg  daily 3. Continue Prozac 20mg  BID   RTC 6 months  Discussed potential benefits, risks, and side effects of stimulants with patient to include increased heart rate, palpitations, insomnia, increased anxiety, increased irritability, or decreased appetite.  Instructed patient to contact office if experiencing any significant tolerability issues.  Discussed potential benefits, risk, and side effects of benzodiazepines to include potential risk  of tolerance and dependence, as well as possible drowsiness.  Advised patient not to drive if experiencing drowsiness and to take lowest possible effective dose to minimize risk of dependence and tolerance.  Patient advised to contact office with any questions, adverse effects, or acute worsening in signs and symptoms.   Diagnoses and all orders for this visit:  Attention deficit hyperactivity disorder (ADHD), combined type -     amphetamine-dextroamphetamine (ADDERALL XR) 25 MG 24 hr capsule; Take 1 capsule by mouth every morning.  Major depressive disorder, recurrent episode, moderate (HCC) -     FLUoxetine (PROZAC) 20 MG capsule; Take 1 capsule (20 mg total) by mouth 2 (two) times daily.  Insomnia, unspecified type -     clonazePAM (KLONOPIN) 1 MG tablet; Take 1 tablet (1 mg total) by mouth daily.  Generalized anxiety disorder -     FLUoxetine (PROZAC) 20 MG capsule; Take 1 capsule (20 mg total) by mouth 2 (two) times daily. -     clonazePAM (KLONOPIN) 1 MG tablet; Take 1 tablet (1 mg total) by mouth daily.   Please see After Visit Summary for patient specific instructions.  No future appointments.  No orders of the defined types were placed in this encounter.     -------------------------------

## 2021-08-20 ENCOUNTER — Ambulatory Visit (INDEPENDENT_AMBULATORY_CARE_PROVIDER_SITE_OTHER): Payer: 59 | Admitting: Adult Health

## 2021-08-20 ENCOUNTER — Encounter: Payer: Self-pay | Admitting: Adult Health

## 2021-08-20 DIAGNOSIS — F411 Generalized anxiety disorder: Secondary | ICD-10-CM | POA: Diagnosis not present

## 2021-08-20 DIAGNOSIS — F331 Major depressive disorder, recurrent, moderate: Secondary | ICD-10-CM

## 2021-08-20 DIAGNOSIS — F902 Attention-deficit hyperactivity disorder, combined type: Secondary | ICD-10-CM | POA: Diagnosis not present

## 2021-08-20 MED ORDER — CLONAZEPAM 1 MG PO TABS: 1.0000 mg | ORAL_TABLET | Freq: Two times a day (BID) | ORAL | 0 refills | Status: DC

## 2021-08-20 MED ORDER — AMPHETAMINE-DEXTROAMPHET ER 25 MG PO CP24: 25.0000 mg | ORAL_CAPSULE | ORAL | 0 refills | Status: DC

## 2021-08-20 MED ORDER — CLONAZEPAM 1 MG PO TABS: 1.0000 mg | ORAL_TABLET | Freq: Every day | ORAL | 0 refills | Status: DC

## 2021-08-20 NOTE — Progress Notes (Signed)
[name] [name] [id number] 10-Oct-1960 61 y.o.  Virtual Visit via Telephone Note  I connected with pt on 08/20/21 at 10:40 AM EST by telephone and verified that I am speaking with the correct person using two identifiers.   I discussed the limitations, risks, security and privacy concerns of performing an evaluation and management service by telephone and the availability of in person appointments. I also discussed with the patient that there may be a patient responsible charge related to this service. The patient expressed understanding and agreed to proceed.   I discussed the assessment and treatment plan with the patient. The patient was provided an opportunity to ask questions and all were answered. The patient agreed with the plan and demonstrated an understanding of the instructions.   The patient was advised to call back or seek an in-person evaluation if the symptoms worsen or if the condition fails to improve as anticipated.  I provided 20 minutes of non-face-to-face time during this encounter.  The patient was located at home.  The provider was located at [facility].   Dorothyann Gibbs, NP   Subjective:   Patient ID:  [name] [name] is a 61 y.o. (DOB 09/25/1960) male.  Chief Complaint: No chief complaint on file.   HPI [name] [name] presents for follow-up of GAD, MDD, ADHD.  Describes mood today as "not the best". Pleasant. Tearful. Mood symptoms - reports depression, anxiety, and irritability at times. More anxious overall. Stating "I'm feeling very emotional". 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 Had a second surgery yesterday and is hopeful. Movement is limited over the next 2 week - having to maintain a certain position. Would like to have access to taking the Clonazepam twice daily to help manage the anxiety of the surgery and his immobility. Feels like medications continue to work well. Seeing  therapist - Baltazar Apo. Stable interest and motivation. Taking medications as prescribed.  Energy levels stable. Active, does not have a regular exercise routine with physical limitations. Enjoys some usual interests and activities. Lives with partner. Spending tie with family and friends. Appetite adequate. Weight stable. Sleeps well most nights - using Clonazepam. Averages 6 to 8 hours. Focus and concentration stable. Completing tasks. Managing aspects of household. Out of work currently - planning to retire.  Denies SI or HI.  Denies AH or VH.  Review of Systems:  Review of Systems  Musculoskeletal:  Negative for gait problem.  Neurological:  Negative for tremors.  Psychiatric/Behavioral:         Please refer to HPI   Medications: I have reviewed the patient's current medications.  Current Outpatient Medications  Medication Sig Dispense Refill   amphetamine-dextroamphetamine (ADDERALL XR) 25 MG 24 hr capsule Take 1 capsule by mouth every morning. 30 capsule 0   amphetamine-dextroamphetamine (ADDERALL XR) 25 MG 24 hr capsule Take 1 capsule by mouth every morning. 30 capsule 0   amphetamine-dextroamphetamine (ADDERALL XR) 25 MG 24 hr capsule Take 1 capsule by mouth every morning. 90 capsule 0   carvedilol (COREG) 12.5 MG tablet      carvedilol (COREG) 6.25 MG tablet Take 6.25 mg by mouth 2 (two) times daily as needed for tremors.     cetirizine (ZYRTEC) 5 MG tablet Take 5 mg by mouth daily as needed for allergies.      clonazePAM (KLONOPIN) 1 MG tablet Take 1 tablet (1 mg total) by mouth daily. 90 tablet 0   cyclobenzaprine (FLEXERIL) 10 MG tablet Take  10 mg by mouth 3 (three) times daily as needed for muscle spasms.      FLUoxetine (PROZAC) 20 MG capsule Take 1 capsule (20 mg total) by mouth 2 (two) times daily. 180 capsule 3   gabapentin (NEURONTIN) 300 MG capsule      HYDROcodone-acetaminophen (NORCO/VICODIN) 5-325 MG tablet Take 1 tablet by mouth every 6 (six) hours as needed  for pain.     nebivolol (BYSTOLIC) 5 MG tablet Take 5 mg by mouth daily.       tadalafil (CIALIS) 5 MG tablet      traZODone (DESYREL) 100 MG tablet Take 100 mg by mouth at bedtime as needed for sleep.     tretinoin (ALTRALIN) 0.05 % gel      No current facility-administered medications for this visit.    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  Social History   Socioeconomic History   Marital status: Single    Spouse name: Not on file   Number of children: Not on file   Years of education: Not on file   Highest education level: Not on file  Occupational History   Not on file  Tobacco Use   Smoking status: Never   Smokeless tobacco: Never  Substance and Sexual Activity   Alcohol use: Yes    Alcohol/week: 1.0 standard drink    Types: 1 Glasses of wine per week   Drug use: No   Sexual activity: Not on file  Other Topics Concern   Not on file  Social History Narrative   Not on file   Social Determinants of Health   Financial Resource Strain: Not on file  Food Insecurity: Not on file  Transportation Needs: Not on file  Physical Activity: Not on file  Stress: Not on file  Social Connections: Not on file  Intimate Partner Violence: Not on file    Past Medical History, Surgical history, Social history, and Family history were reviewed and updated as appropriate.   Please see review of systems for further details on the patient's review from today.   Objective:   Physical Exam:  There were no vitals taken for this visit.  Physical Exam Neurological:     Mental Status: He is alert and oriented to person, place, and time.     Cranial Nerves: No dysarthria.  Psychiatric:        Attention  and Perception: Attention and perception normal.        Mood and Affect: Mood normal.        Speech: Speech normal.        Behavior: Behavior is cooperative.        Thought Content: Thought content normal. Thought content is not paranoid or delusional. Thought content does not include homicidal or suicidal ideation. Thought content does not include homicidal or suicidal plan.        Cognition and Memory: Cognition and memory normal.        Judgment: Judgment normal.     Comments: Insight intact    Lab Review:     Component Value Date/Time   NA 135 06/13/2016 1117   K 4.1 06/13/2016 1117   CL  98 (L) 06/13/2016 1117   CO2 28 06/13/2016 1117   GLUCOSE 102 (H) 06/13/2016 1117   BUN 18 06/13/2016 1117   CREATININE 1.01 06/13/2016 1117   CALCIUM 8.8 (L) 06/13/2016 1117   GFRNONAA >60 06/13/2016 1117   GFRAA >60 06/13/2016 1117       Component Value Date/Time   WBC 10.1 06/13/2016 1117   RBC 5.97 (H) 06/13/2016 1117   HGB 17.9 (H) 06/13/2016 1117   HGB 17.4 (H) 09/26/2011 1016   HCT 50.8 06/13/2016 1117   HCT 51.1 (H) 09/26/2011 1016   PLT 201 06/13/2016 1117   PLT 277 09/26/2011 1016   MCV 85.1 06/13/2016 1117   MCV 87.0 09/26/2011 1016   MCH 30.0 06/13/2016 1117   MCHC 35.2 06/13/2016 1117   RDW 12.5 06/13/2016 1117   RDW 12.6 09/26/2011 1016   LYMPHSABS 2.2 09/26/2011 1016   MONOABS 0.5 09/26/2011 1016   EOSABS 0.0 09/26/2011 1016   BASOSABS 0.1 09/26/2011 1016    No results found for: POCLITH, LITHIUM   No results found for: PHENYTOIN, PHENOBARB, VALPROATE, CBMZ   .res Assessment: Plan:    Plan:  1. Increase Clonazepam 1mg  at bedtime to BID for the next month  2. Continue Adderall XR 25mg  daily 3. Continue Prozac 20mg  BID   RTC 6 months  Discussed potential benefits, risks, and side effects of stimulants with patient to include increased heart rate, palpitations, insomnia, increased anxiety, increased irritability, or decreased appetite.  Instructed patient  to contact office if experiencing any significant tolerability issues.  Discussed potential benefits, risk, and side effects of benzodiazepines to include potential risk of tolerance and dependence, as well as possible drowsiness.  Advised patient not to drive if experiencing drowsiness and to take lowest possible effective dose to minimize risk of dependence and tolerance.  Patient advised to contact office with any questions, adverse effects, or acute worsening in signs and symptoms.   Diagnoses and all orders for this visit:  Major depressive disorder, recurrent episode, moderate (HCC)  Generalized anxiety disorder -     Discontinue: clonazePAM (KLONOPIN) 1 MG tablet; Take 1 tablet (1 mg total) by mouth 2 (two) times daily. -     clonazePAM (KLONOPIN) 1 MG tablet; Take 1 tablet (1 mg total) by mouth daily.  Attention deficit hyperactivity disorder (ADHD), combined type -     amphetamine-dextroamphetamine (ADDERALL XR) 25 MG 24 hr capsule; Take 1 capsule by mouth every morning.    Please see After Visit Summary for patient specific instructions.  No future appointments.  No orders of the defined types were placed in this encounter.     -------------------------------

## 2021-11-16 ENCOUNTER — Other Ambulatory Visit: Payer: Self-pay | Admitting: Adult Health

## 2021-11-16 DIAGNOSIS — F411 Generalized anxiety disorder: Secondary | ICD-10-CM

## 2021-11-17 NOTE — Telephone Encounter (Signed)
Last filled 2/9 x90 days, due 5/8 ?

## 2021-11-18 ENCOUNTER — Other Ambulatory Visit: Payer: Self-pay | Admitting: Adult Health

## 2021-11-18 DIAGNOSIS — F411 Generalized anxiety disorder: Secondary | ICD-10-CM

## 2021-11-19 ENCOUNTER — Other Ambulatory Visit: Payer: Self-pay | Admitting: Adult Health

## 2021-11-19 DIAGNOSIS — F411 Generalized anxiety disorder: Secondary | ICD-10-CM

## 2021-11-19 NOTE — Telephone Encounter (Signed)
RF sent 5/8. Keep getting duplicates.  ?

## 2021-11-23 ENCOUNTER — Other Ambulatory Visit: Payer: Self-pay | Admitting: Adult Health

## 2021-11-23 DIAGNOSIS — F411 Generalized anxiety disorder: Secondary | ICD-10-CM

## 2022-02-15 ENCOUNTER — Telehealth (INDEPENDENT_AMBULATORY_CARE_PROVIDER_SITE_OTHER): Payer: 59 | Admitting: Adult Health

## 2022-02-15 ENCOUNTER — Encounter: Payer: Self-pay | Admitting: Adult Health

## 2022-02-15 DIAGNOSIS — F902 Attention-deficit hyperactivity disorder, combined type: Secondary | ICD-10-CM | POA: Diagnosis not present

## 2022-02-15 DIAGNOSIS — F331 Major depressive disorder, recurrent, moderate: Secondary | ICD-10-CM | POA: Diagnosis not present

## 2022-02-15 DIAGNOSIS — F411 Generalized anxiety disorder: Secondary | ICD-10-CM | POA: Diagnosis not present

## 2022-02-15 MED ORDER — CLONAZEPAM 1 MG PO TABS: 1.0000 mg | ORAL_TABLET | Freq: Two times a day (BID) | ORAL | 0 refills | Status: DC | PRN

## 2022-02-15 MED ORDER — CLONAZEPAM 1 MG PO TABS: 1.0000 mg | ORAL_TABLET | Freq: Two times a day (BID) | ORAL | 0 refills | Status: DC

## 2022-02-15 NOTE — Progress Notes (Signed)
[name] [name] [id number] 10/01/1960 61 y.o.  Virtual Visit via Telephone Note  I connected with pt on 02/15/22 at  8:40 AM EDT by telephone and verified that I am speaking with the correct person using two identifiers.   I discussed the limitations, risks, security and privacy concerns of performing an evaluation and management service by telephone and the availability of in person appointments. I also discussed with the patient that there may be a patient responsible charge related to this service. The patient expressed understanding and agreed to proceed.   I discussed the assessment and treatment plan with the patient. The patient was provided an opportunity to ask questions and all were answered. The patient agreed with the plan and demonstrated an understanding of the instructions.   The patient was advised to call back or seek an in-person evaluation if the symptoms worsen or if the condition fails to improve as anticipated.  I provided 20 minutes of non-face-to-face time during this encounter.  The patient was located at home.  The provider was located at [facility].   Dorothyann Gibbs, NP   Subjective:   Patient ID:  [name] [name] is a 61 y.o. (DOB 1960/09/15) male.  Chief Complaint: No chief complaint on file.   HPI Ki Corbo presents for follow-up of GAD, MDD, ADHD.  Describes mood today as "ok". Pleasant. Tearful at times. Mood symptoms - reports depression, anxiety, and irritability - "at times". Mood is consistent. Stating "I'm doing alright". Has recovered from eye surgery. Feels like medications continue to work well. Seeing therapist - Baltazar Apo. Stable interest and motivation. Taking medications as prescribed.  Energy levels stable. Active, does not have a regular exercise routine with physical limitations. Enjoys some usual interests and activities. Lives with partner. Spending time with family and friends. Appetite adequate. Weight  stable. Sleeps well most nights - using Clonazepam. Averages 6 to 8 hours. Focus and concentration stable. Completing tasks. Managing aspects of household. Has returned to work.  Denies SI or HI.  Denies AH or VH.   Review of Systems:  Review of Systems  Constitutional: Negative.   HENT: Negative.    Eyes: Negative.   Cardiovascular: Negative.   Gastrointestinal: Negative.   Endocrine: Negative.   Genitourinary: Negative.   Musculoskeletal: Negative.  Negative for gait problem.  Skin: Negative.   Allergic/Immunologic: Negative.   Neurological: Negative.  Negative for tremors.  Hematological: Negative.   Psychiatric/Behavioral:         Please refer to HPI  All other systems reviewed and are negative.   Medications: I have reviewed the patient's current medications.  Current Outpatient Medications  Medication Sig Dispense Refill   amphetamine-dextroamphetamine (ADDERALL XR) 25 MG 24 hr capsule Take 1 capsule by mouth every morning. 30 capsule 0   amphetamine-dextroamphetamine (ADDERALL XR) 25 MG 24 hr capsule Take 1 capsule by mouth every morning. 30 capsule 0   amphetamine-dextroamphetamine (ADDERALL XR) 25 MG 24 hr capsule Take 1 capsule by mouth every morning. 90 capsule 0   carvedilol (COREG) 12.5 MG tablet      carvedilol (COREG) 6.25 MG tablet Take 6.25 mg by mouth 2 (two) times daily as needed for tremors.     cetirizine (ZYRTEC) 5 MG tablet Take 5 mg by mouth daily as needed for allergies.      clonazePAM (KLONOPIN) 1 MG tablet TAKE 1 TABLET DAILY 90 tablet 0   cyclobenzaprine (FLEXERIL) 10 MG tablet Take 10 mg by mouth 3 (three) times daily as needed  for muscle spasms.      FLUoxetine (PROZAC) 20 MG capsule Take 1 capsule (20 mg total) by mouth 2 (two) times daily. 180 capsule 3   gabapentin (NEURONTIN) 300 MG capsule      HYDROcodone-acetaminophen (NORCO/VICODIN) 5-325 MG tablet Take 1 tablet by mouth every 6 (six) hours as needed for pain.     nebivolol (BYSTOLIC) 5  MG tablet Take 5 mg by mouth daily.       tadalafil (CIALIS) 5 MG tablet      traZODone (DESYREL) 100 MG tablet Take 100 mg by mouth at bedtime as needed for sleep.     tretinoin (ALTRALIN) 0.05 % gel      No current facility-administered medications for this visit.    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  Social History   Socioeconomic History   Marital status: Single    Spouse name: Not on file   Number of children: Not on file   Years of education: Not on file   Highest education level: Not on file  Occupational History   Not on file  Tobacco Use   Smoking status: Never   Smokeless tobacco: Never  Substance and Sexual Activity   Alcohol use: Yes    Alcohol/week: 1.0 standard drink of alcohol    Types: 1 Glasses of wine per week   Drug use: No   Sexual activity: Not on file  Other Topics Concern   Not on file  Social History Narrative   Not on file   Social Determinants of Health   Financial Resource Strain: Not on file  Food Insecurity: Not on file  Transportation Needs: Not on file  Physical Activity: Not on file  Stress: Not on file  Social Connections: Not on file  Intimate Partner Violence: Not on file    Past Medical History, Surgical history, Social history, and Family history were reviewed and updated as appropriate.   Please see review of systems for further details on the patient's review from today.   Objective:   Physical Exam:  There were no vitals taken for this visit.  Physical Exam Constitutional:      General: He is not in acute distress. Musculoskeletal:        General: No deformity.  Neurological:     Mental Status: He is alert and oriented to person,  place, and time.     Coordination: Coordination normal.  Psychiatric:        Attention and Perception: Attention and perception normal. He does not perceive auditory or visual hallucinations.        Mood and Affect: Mood normal. Mood is not anxious or depressed. Affect is not labile, blunt, angry or inappropriate.        Speech: Speech normal.        Behavior: Behavior normal.        Thought Content: Thought content normal. Thought content is not paranoid or delusional. Thought content does not include homicidal or suicidal ideation. Thought content does not include homicidal or suicidal plan.        Cognition and Memory: Cognition and memory normal.  Judgment: Judgment normal.     Comments: Insight intact     Lab Review:     Component Value Date/Time   NA 135 06/13/2016 1117   K 4.1 06/13/2016 1117   CL 98 (L) 06/13/2016 1117   CO2 28 06/13/2016 1117   GLUCOSE 102 (H) 06/13/2016 1117   BUN 18 06/13/2016 1117   CREATININE 1.01 06/13/2016 1117   CALCIUM 8.8 (L) 06/13/2016 1117   GFRNONAA >60 06/13/2016 1117   GFRAA >60 06/13/2016 1117       Component Value Date/Time   WBC 10.1 06/13/2016 1117   RBC 5.97 (H) 06/13/2016 1117   HGB 17.9 (H) 06/13/2016 1117   HGB 17.4 (H) 09/26/2011 1016   HCT 50.8 06/13/2016 1117   HCT 51.1 (H) 09/26/2011 1016   PLT 201 06/13/2016 1117   PLT 277 09/26/2011 1016   MCV 85.1 06/13/2016 1117   MCV 87.0 09/26/2011 1016   MCH 30.0 06/13/2016 1117   MCHC 35.2 06/13/2016 1117   RDW 12.5 06/13/2016 1117   RDW 12.6 09/26/2011 1016   LYMPHSABS 2.2 09/26/2011 1016   MONOABS 0.5 09/26/2011 1016   EOSABS 0.0 09/26/2011 1016   BASOSABS 0.1 09/26/2011 1016    No results found for: "POCLITH", "LITHIUM"   No results found for: "PHENYTOIN", "PHENOBARB", "VALPROATE", "CBMZ"   .res Assessment: Plan:     Plan:  1. Clonazepam 1mg  daily 2. Adderall XR 25mg  daily 3. Prozac 20mg  BID   RTC 6 months  Discussed potential benefits, risks, and  side effects of stimulants with patient to include increased heart rate, palpitations, insomnia, increased anxiety, increased irritability, or decreased appetite.  Instructed patient to contact office if experiencing any significant tolerability issues.  Discussed potential benefits, risk, and side effects of benzodiazepines to include potential risk of tolerance and dependence, as well as possible drowsiness.  Advised patient not to drive if experiencing drowsiness and to take lowest possible effective dose to minimize risk of dependence and tolerance.  Patient advised to contact office with any questions, adverse effects, or acute worsening in signs and symptoms. There are no diagnoses linked to this encounter.  Please see After Visit Summary for patient specific instructions.  Future Appointments  Date Time Provider Department Center  02/15/2022  8:40 AM Melaney Tellefsen, , NP [facility] None    No orders of the defined types were placed in this encounter.     -------------------------------

## 2022-08-12 ENCOUNTER — Ambulatory Visit (INDEPENDENT_AMBULATORY_CARE_PROVIDER_SITE_OTHER): Payer: 59 | Admitting: Adult Health

## 2022-08-12 ENCOUNTER — Encounter: Payer: Self-pay | Admitting: Adult Health

## 2022-08-12 DIAGNOSIS — F331 Major depressive disorder, recurrent, moderate: Secondary | ICD-10-CM

## 2022-08-12 DIAGNOSIS — F411 Generalized anxiety disorder: Secondary | ICD-10-CM | POA: Diagnosis not present

## 2022-08-12 DIAGNOSIS — F902 Attention-deficit hyperactivity disorder, combined type: Secondary | ICD-10-CM

## 2022-08-12 MED ORDER — FLUOXETINE HCL 20 MG PO CAPS: 20.0000 mg | ORAL_CAPSULE | Freq: Every day | ORAL | 3 refills | Status: DC

## 2022-08-12 MED ORDER — CLONAZEPAM 1 MG PO TABS: 1.0000 mg | ORAL_TABLET | Freq: Two times a day (BID) | ORAL | 0 refills | Status: DC | PRN

## 2022-08-12 NOTE — Progress Notes (Signed)
[name] [name] [id number] [birth year] 62 y.o.  Virtual Visit via Telephone Note  I connected with pt on 08/12/22 at  8:00 AM EST by telephone and verified that I am speaking with the correct person using two identifiers.   I discussed the limitations, risks, security and privacy concerns of performing an evaluation and management service by telephone and the availability of in person appointments. I also discussed with the patient that there may be a patient responsible charge related to this service. The patient expressed understanding and agreed to proceed.   I discussed the assessment and treatment plan with the patient. The patient was provided an opportunity to ask questions and all were answered. The patient agreed with the plan and demonstrated an understanding of the instructions.   The patient was advised to call back or seek an in-person evaluation if the symptoms worsen or if the condition fails to improve as anticipated.  I provided 20 minutes of non-face-to-face time during this encounter.  The patient was located at home.  The provider was located at [facility].   Dorothyann Gibbs, NP   Subjective:   Patient ID:  [name] [name] is a 62 y.o. (DOB 28-Nov-1960) male.  Chief Complaint: No chief complaint on file.   HPI Zakhari Fogel presents for follow-up of GAD, MDD, ADHD.  Describes mood today as "ok". Pleasant. Tearful at times. Mood symptoms - reports some depression, anxiety, and irritability. Mood is consistent. Stating "I'm doing better". Reports eye surgeries - recovering - feeling more "hopeful" about maintaining vision in his left eye. Planning to retire this year. Feels   like medications continue to work well. Seeing therapist - Baltazar Apo. Stable interest and motivation. Taking medications as prescribed.  Energy levels stable. Active, does not have a regular exercise routine with physical limitations. Enjoys some usual interests and  activities. Lives with partner. Spending time with family and friends. Appetite adequate. Weight stable. Sleeps well most nights - uses Clonazepam. Averages 6 to 8 hours. Focus and concentration stable. Completing tasks. Managing aspects of household. Plans to retire over next few months.   Denies SI or HI.  Denies AH or VH.   Review of Systems:  Review of Systems  Musculoskeletal:  Negative for gait problem.  Neurological:  Negative for tremors.  Psychiatric/Behavioral:         Please refer to HPI    Medications: I have reviewed the patient's current medications.  Current Outpatient Medications  Medication Sig Dispense Refill   amphetamine-dextroamphetamine (ADDERALL XR) 25 MG 24 hr capsule Take 1 capsule by mouth every morning. 30 capsule 0   amphetamine-dextroamphetamine (ADDERALL XR) 25 MG 24 hr capsule Take 1 capsule by mouth every morning. 30 capsule 0   amphetamine-dextroamphetamine (ADDERALL XR) 25 MG 24 hr capsule Take 1 capsule by mouth every morning. 90 capsule 0   carvedilol (COREG) 12.5 MG tablet      carvedilol (COREG) 6.25 MG tablet Take 6.25 mg by mouth 2 (two) times daily as needed for tremors.     cetirizine (ZYRTEC) 5 MG tablet Take 5 mg by mouth daily as needed for allergies.      clonazePAM (KLONOPIN) 1 MG tablet Take 1 tablet (1 mg total) by mouth 2 (two) times daily as needed for anxiety. 180 tablet 0   clonazePAM (KLONOPIN) 1 MG tablet Take 1 tablet (1 mg total) by mouth 2 (two) times daily. 60 tablet 0   cyclobenzaprine (FLEXERIL) 10 MG tablet Take 10 mg by mouth 3 (three)  times daily as needed for muscle spasms.      FLUoxetine (PROZAC) 20 MG capsule Take 1 capsule (20 mg total) by mouth 2 (two) times daily. 180 capsule 3   gabapentin (NEURONTIN) 300 MG capsule      HYDROcodone-acetaminophen (NORCO/VICODIN) 5-325 MG tablet Take 1 tablet by mouth every 6 (six) hours as needed for pain.     nebivolol (BYSTOLIC) 5 MG tablet Take 5 mg by mouth daily.        tadalafil (CIALIS) 5 MG tablet      tretinoin (ALTRALIN) 0.05 % gel      No current facility-administered medications for this visit.    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  Social History   Socioeconomic History   Marital status: Single    Spouse name: Not on file   Number of children: Not on file   Years of education: Not on file   Highest education level: Not on file  Occupational History   Not on file  Tobacco Use   Smoking status: Never   Smokeless tobacco: Never  Substance and Sexual Activity   Alcohol use: Yes    Alcohol/week: 1.0 standard drink of alcohol    Types: 1 Glasses of wine per week   Drug use: No   Sexual activity: Not on file  Other Topics Concern   Not on file  Social History Narrative   Not on file   Social Determinants of Health   Financial Resource Strain: Not on file  Food Insecurity: Not on file  Transportation Needs: Not on file  Physical Activity: Not on file  Stress: Not on file  Social Connections: Not on file  Intimate Partner Violence: Not on file    Past Medical History, Surgical history, Social history, and Family history were reviewed and updated as appropriate.   Please see review of systems for further details on the patient's review from today.   Objective:   Physical Exam:  There were no vitals taken for this visit.  Physical Exam Constitutional:      General: He is not in acute distress. Musculoskeletal:        General: No deformity.  Neurological:     Mental Status: He is alert and oriented to person, place, and time.     Coordination: Coordination normal.  Psychiatric:        Attention and Perception: Attention and perception  normal. He does not perceive auditory or visual hallucinations.        Mood and Affect: Mood normal. Mood is not anxious or depressed. Affect is not labile, blunt, angry or inappropriate.        Speech: Speech normal.        Behavior: Behavior normal.        Thought Content: Thought content normal. Thought content is not paranoid or delusional. Thought content does not include homicidal or suicidal ideation. Thought content does not include homicidal or suicidal plan.        Cognition and Memory: Cognition and memory normal.        Judgment: Judgment normal.     Comments: Insight intact     Lab Review:  Component Value Date/Time   NA 135 06/13/2016 1117   K 4.1 06/13/2016 1117   CL 98 (L) 06/13/2016 1117   CO2 28 06/13/2016 1117   GLUCOSE 102 (H) 06/13/2016 1117   BUN 18 06/13/2016 1117   CREATININE 1.01 06/13/2016 1117   CALCIUM 8.8 (L) 06/13/2016 1117   GFRNONAA >60 06/13/2016 1117   GFRAA >60 06/13/2016 1117       Component Value Date/Time   WBC 10.1 06/13/2016 1117   RBC 5.97 (H) 06/13/2016 1117   HGB 17.9 (H) 06/13/2016 1117   HGB 17.4 (H) 09/26/2011 1016   HCT 50.8 06/13/2016 1117   HCT 51.1 (H) 09/26/2011 1016   PLT 201 06/13/2016 1117   PLT 277 09/26/2011 1016   MCV 85.1 06/13/2016 1117   MCV 87.0 09/26/2011 1016   MCH 30.0 06/13/2016 1117   MCHC 35.2 06/13/2016 1117   RDW 12.5 06/13/2016 1117   RDW 12.6 09/26/2011 1016   LYMPHSABS 2.2 09/26/2011 1016   MONOABS 0.5 09/26/2011 1016   EOSABS 0.0 09/26/2011 1016   BASOSABS 0.1 09/26/2011 1016    No results found for: "POCLITH", "LITHIUM"   No results found for: "PHENYTOIN", "PHENOBARB", "VALPROATE", "CBMZ"   .res Assessment: Plan:    Plan:  Prozac 20mg  daily Clonazepam 1mg  daily  D/C Adderall XR 25mg  daily - has stopped taking   RTC 6 months  Discussed potential benefits, risks, and side effects of stimulants with patient to include increased heart rate, palpitations, insomnia, increased  anxiety, increased irritability, or decreased appetite.  Instructed patient to contact office if experiencing any significant tolerability issues.  Discussed potential benefits, risk, and side effects of benzodiazepines to include potential risk of tolerance and dependence, as well as possible drowsiness.  Advised patient not to drive if experiencing drowsiness and to take lowest possible effective dose to minimize risk of dependence and tolerance.  Patient advised to contact office with any questions, adverse effects, or acute worsening in signs and symptoms.  Diagnoses and all orders for this visit:  Major depressive disorder, recurrent episode, moderate (HCC)  Generalized anxiety disorder  Attention deficit hyperactivity disorder (ADHD), combined type    Please see After Visit Summary for patient specific instructions.  Future Appointments  Date Time Provider [place]  08/12/2022  8:00 AM Wenzel Backlund, Berdie Ogren, NP CP-CP None    No orders of the defined types were placed in this encounter.     -------------------------------

## 2022-10-18 ENCOUNTER — Encounter: Payer: Self-pay | Admitting: Family Medicine

## 2022-10-18 ENCOUNTER — Ambulatory Visit (INDEPENDENT_AMBULATORY_CARE_PROVIDER_SITE_OTHER): Payer: 59 | Admitting: Family Medicine

## 2022-10-18 VITALS — BP 130/78 | HR 61 | Temp 98.1°F | Ht 69.0 in | Wt 188.4 lb

## 2022-10-18 DIAGNOSIS — F418 Other specified anxiety disorders: Secondary | ICD-10-CM

## 2022-10-18 DIAGNOSIS — R197 Diarrhea, unspecified: Secondary | ICD-10-CM

## 2022-10-18 DIAGNOSIS — H3322 Serous retinal detachment, left eye: Secondary | ICD-10-CM | POA: Diagnosis not present

## 2022-10-18 DIAGNOSIS — G250 Essential tremor: Secondary | ICD-10-CM

## 2022-10-18 DIAGNOSIS — I10 Essential (primary) hypertension: Secondary | ICD-10-CM

## 2022-10-18 DIAGNOSIS — K219 Gastro-esophageal reflux disease without esophagitis: Secondary | ICD-10-CM

## 2022-10-18 LAB — COMPREHENSIVE METABOLIC PANEL
ALT: 16 U/L (ref 0–53)
AST: 14 U/L (ref 0–37)
Albumin: 4.3 g/dL (ref 3.5–5.2)
Alkaline Phosphatase: 77 U/L (ref 39–117)
BUN: 13 mg/dL (ref 6–23)
CO2: 28 mEq/L (ref 19–32)
Calcium: 9.4 mg/dL (ref 8.4–10.5)
Chloride: 103 mEq/L (ref 96–112)
Creatinine, Ser: 0.82 mg/dL (ref 0.40–1.50)
GFR: 94.22 mL/min (ref >60.00)
Glucose, Bld: 98 mg/dL (ref 70–99)
Potassium: 4.3 mEq/L (ref 3.5–5.1)
Sodium: 135 mEq/L (ref 135–145)
Total Bilirubin: 0.5 mg/dL (ref 0.2–1.2)
Total Protein: 6.7 g/dL (ref 6.0–8.3)

## 2022-10-18 LAB — TSH: TSH: 1.1 u[IU]/mL (ref 0.35–5.50)

## 2022-10-18 LAB — CBC WITH DIFFERENTIAL/PLATELET
Basophils Absolute: 0 10*3/uL (ref 0.0–0.1)
Basophils Relative: 0.4 % (ref 0.0–3.0)
Eosinophils Absolute: 0.1 10*3/uL (ref 0.0–0.7)
Eosinophils Relative: 1.8 % (ref 0.0–5.0)
HCT: 48.3 % (ref 39.0–52.0)
Hemoglobin: 16.6 g/dL (ref 13.0–17.0)
Lymphocytes Relative: 29.1 % (ref 12.0–46.0)
Lymphs Abs: 2.2 10*3/uL (ref 0.7–4.0)
MCHC: 34.4 g/dL (ref 30.0–36.0)
MCV: 85.8 fl (ref 78.0–100.0)
Monocytes Absolute: 0.6 10*3/uL (ref 0.1–1.0)
Monocytes Relative: 7.8 % (ref 3.0–12.0)
Neutro Abs: 4.7 10*3/uL (ref 1.4–7.7)
Neutrophils Relative %: 60.9 % (ref 43.0–77.0)
Platelets: 299 10*3/uL (ref 150.0–400.0)
RBC: 5.63 Mil/uL (ref 4.22–5.81)
RDW: 14 % (ref 11.5–15.5)
WBC: 7.7 10*3/uL (ref 4.0–10.5)

## 2022-10-18 LAB — LIPID PANEL
Cholesterol: 248 mg/dL — ABNORMAL HIGH (ref 0–200)
HDL: 45.4 mg/dL (ref >39.00)
NonHDL: 202.29
Total CHOL/HDL Ratio: 5
Triglycerides: 244 mg/dL — ABNORMAL HIGH (ref 0.0–149.0)
VLDL: 48.8 mg/dL — ABNORMAL HIGH (ref 0.0–40.0)

## 2022-10-18 LAB — LDL CHOLESTEROL, DIRECT: Direct LDL: 174 mg/dL

## 2022-10-18 LAB — HEMOGLOBIN A1C: Hgb A1c MFr Bld: 5.4 % (ref 4.6–6.5)

## 2022-10-18 LAB — SEDIMENTATION RATE: Sed Rate: 16 mm/hr (ref 0–20)

## 2022-10-18 LAB — C-REACTIVE PROTEIN: CRP: <1 mg/dL (ref 0.5–20.0)

## 2022-10-18 NOTE — Progress Notes (Unsigned)
New Patient Office Visit  Subjective:  Patient ID: [name] [name], male    DOB: 03/16/1961  Age: 62 y.o. MRN: [id number]  CC:  Chief Complaint  Patient presents with   Establish Care    Initial visit to establish care with new pcp Not fasting Discuss detached retina, medications, diarrhea     HPI [name] [name] presents for new patient. Diarrhea.  Here w/partner [name]  Diarrhea-patient thought from beta blocker-has tried several-still diarrhea. Will get for 1 week(s) or 2 and then immodium and slows, but then returns.  Quit coreg Jan.  Still diarrhea.  1-2x/day.  More loose stool.  Colonoscopy 71yrs ago.    Diarrhea for 1 year(s).  Lost 30# but also diet changes.  No blood.  No fevers/chills.  No pain.  Fiber helps some but still loose.   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  Dr. Zadie Rhine now.  Ocular pressure increased so started on drops 1 week(s) ago and mult SE so stopped Depression/anxiety-seeing therapist and psychiatrist-Prozac doing well x after 2 years, will need to change for short time then back to prozac.  Depression cyclical-about every 2 years flares.  Doing well now. No SI. HYPERTENSION-on  diltiazem 120.doesn't check as makes him anxious.   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 Has tried several beta blocker-all cause diarrhea(but still w/diarrhea off them).  Tremors aren't bad enough to stay on meds.  Had gained weight during 2020 30# but losing now-diarrhea GERD-history of dilitations in past. 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 No pertinent family history.  Social History   Socioeconomic History   Marital status: Single    Spouse name: Not on file   Number of children: 0   Years of education: Not on file   Highest education level: Not on file  Occupational History   Not on file  Tobacco Use   Smoking status: Never   Smokeless tobacco: Never  Vaping Use   Vaping Use: Never used  Substance and Sexual Activity   Alcohol use: Yes   Drug use: No   Sexual activity: Not on file  Other Topics Concern   Not on file  Social History Narrative   Retiring Economist   Social Determinants of Health   Financial Resource Strain: Not on file  Food Insecurity: Not on file  Transportation Needs: Not on file  Physical Activity: Not on file  Stress: Not on file  Social Connections: Not on file  Intimate Partner Violence: Not on file    ROS  ROS: Gen: no fever, chills  Skin: no rash, itching ENT: no ear pain, ear drainage, nasal congestion, rhinorrhea, sinus pressure, sore throat Eyes: no blurry vision, double vision Resp: no cough, wheeze,SOB CV: no CP, palpitations, LE edema,  GI: HPI GU: no dysuria, urgency, frequency, hematuria MSK: chronic hip/back pain.  Mult studies and documents.  Ibu frequency in past, but not now-just tylenol.  Neuro: no dizziness, headache, weakness, vertigo  Psych:HPI  Objective:   Today's Vitals: BP 130/78   Pulse 61   Temp 98.1 F (36.7 C) (Temporal)   Ht 5\' 9"  (1.753 m)   Wt 188 lb 6 oz (85.4 kg)   SpO2 96%   BMI 27.82 kg/m   Physical Exam  Gen: WDWN NAD HEENT: NCAT, conjunctiva injected L, sclera nonicteric . Nystagmus R NECK:  supple, no thyromegaly, no nodes, no carotid bruits CARDIAC: RRR, S1S2+, no murmur. DP 2+B LUNGS: CTAB. No wheezes ABDOMEN:  BS+, soft, NTND, No HSM, no masses EXT:  no edema MSK: no gross abnormalities.  NEURO: A&O x3.  CN II-XII intact.  PSYCH: normal mood. Good eye  contact   Assessment & Plan:   Problem List Items Addressed This Visit       Cardiovascular and Mediastinum   Primary hypertension - Primary   Relevant Medications   diltiazem (CARDIZEM CD) 120 MG 24 hr capsule   Other Relevant Orders   Comprehensive metabolic panel (Completed)   CBC with Differential/Platelet (Completed)   Hemoglobin A1c (Completed)   Lipid panel (Completed)     Nervous and Auditory   Benign familial tremor     Other   Anxiety with depression   Other Visit Diagnoses     Diarrhea, unspecified type       Relevant Orders   Comprehensive metabolic panel (Completed)   TSH (Completed)   CBC with Differential/Platelet (Completed)   Sedimentation rate (Completed)   C-reactive protein (Completed)   Hemoglobin A1c (Completed)   Ambulatory referral to Gastroenterology   Detached retina, left       Gastroesophageal reflux disease without esophagitis         1.  Hypertension-chronic.  Controlled.  Continue Cardizem CD 120 mg daily.  Check CMP, CBC, A1c, lipids 2.  Diarrhea-for 1 year.  Patient thought due to beta-blockers, however he has been off of all beta-blockers for 3 months and still has diarrhea.  Will check CBC, CMP, TSH, sed rate, CRP.  Refer to GI. 3.  Detached retina left-has had several surgeries.  Has lost a lot of vision.  Currently stable.  Managed by retinologist Dr. Zadie Rhine. 4.  Depression/anxiety-chronic.  Well-controlled.  Continue Prozac 20 mg daily takes clonazepam 1 mg at at bedtime and occasionally daytime.  Managed by psych and counseling. 5.  Benign familial tremor-side effects to beta-blockers.  He is doing okay without them.  For now, no treatment.  If he is having a really bad day, he can try half of a clonazepam (advised not to take this regularly) consider other medications if becomes problematic 6.  GERD-history of dilatations in the past.  Not taking any medications.  Doing well.  Follow-up annual physical in 3 months  Outpatient  Encounter Medications as of 10/18/2022  Medication Sig   cetirizine (ZYRTEC) 5 MG tablet Take 5 mg by mouth daily as needed for allergies.    clonazePAM (KLONOPIN) 1 MG tablet Take 1 tablet (1 mg total) by mouth 2 (two) times daily as needed for anxiety.   cyclobenzaprine (FLEXERIL) 10 MG tablet Take 10 mg by mouth 3 (three) times daily as needed for muscle spasms.    diltiazem (CARDIZEM CD) 120 MG 24 hr capsule Take 120 mg by mouth daily.   FLUoxetine (PROZAC) 20 MG capsule Take 1 capsule (20 mg total) by mouth daily.   tretinoin (ALTRALIN) 0.05 % gel    [DISCONTINUED] carvedilol (COREG) 6.25 MG tablet Take 6.25 mg by mouth 2 (two) times daily  as needed for tremors.   [DISCONTINUED] amphetamine-dextroamphetamine (ADDERALL XR) 25 MG 24 hr capsule Take 1 capsule by mouth every morning.   [DISCONTINUED] amphetamine-dextroamphetamine (ADDERALL XR) 25 MG 24 hr capsule Take 1 capsule by mouth every morning.   [DISCONTINUED] amphetamine-dextroamphetamine (ADDERALL XR) 25 MG 24 hr capsule Take 1 capsule by mouth every morning.   [DISCONTINUED] brimonidine (ALPHAGAN) 0.15 % ophthalmic solution Place 1 drop into the left eye 2 (two) times daily. (Patient not taking: Reported on 10/18/2022)   [DISCONTINUED] carvedilol (COREG) 12.5 MG tablet    [DISCONTINUED] clonazePAM (KLONOPIN) 1 MG tablet Take 1 tablet (1 mg total) by mouth 2 (two) times daily.   [DISCONTINUED] gabapentin (NEURONTIN) 300 MG capsule    [DISCONTINUED] HYDROcodone-acetaminophen (NORCO/VICODIN) 5-325 MG tablet Take 1 tablet by mouth every 6 (six) hours as needed for pain.   [DISCONTINUED] nebivolol (BYSTOLIC) 5 MG tablet Take 5 mg by mouth daily.   (Patient not taking: Reported on 10/18/2022)   [DISCONTINUED] omega-3 acid ethyl esters (LOVAZA) 1 G capsule Take 2 g by mouth 2 (two) times daily.     [DISCONTINUED] tadalafil (CIALIS) 5 MG tablet    No facility-administered encounter medications on file as of 10/18/2022.    Follow-up: Return in  about 3 months (around 01/17/2023) for annual.   Wellington Hampshire, MD

## 2022-10-18 NOTE — Patient Instructions (Signed)
Welcome to [facility]! It was a pleasure meeting you today.  As discussed, Please schedule a 6 month follow up visit today.  PLEASE NOTE:  If you had any LAB tests please let us know if you have not heard back within a few days. You may see your results on MyChart before we have a chance to review them but we will give you a call once they are reviewed by us. If we ordered any REFERRALS today, please let us know if you have not heard from their office within the next week.  Let us know through MyChart if you are needing REFILLS, or have your pharmacy send us the request. You can also use MyChart to communicate with me or any office staff.  Please try these tips to maintain a healthy lifestyle:  Eat most of your calories during the day when you are active. Eliminate processed foods including packaged sweets (pies, cakes, cookies), reduce intake of potatoes, white bread, white pasta, and white rice. Look for whole grain options, oat flour or almond flour.  Each meal should contain half fruits/vegetables, one quarter protein, and one quarter carbs (no bigger than a computer mouse).  Cut down on sweet beverages. This includes juice, soda, and sweet tea. Also watch fruit intake, though this is a healthier sweet option, it still contains natural sugar! Limit to 3 servings daily.  Drink at least 1 glass of water with each meal and aim for at least 8 glasses per day  Exercise at least 150 minutes every week.   

## 2022-10-19 ENCOUNTER — Encounter: Payer: Self-pay | Admitting: Family Medicine

## 2022-10-19 DIAGNOSIS — F418 Other specified anxiety disorders: Secondary | ICD-10-CM | POA: Insufficient documentation

## 2022-10-19 DIAGNOSIS — G250 Essential tremor: Secondary | ICD-10-CM | POA: Insufficient documentation

## 2022-10-19 NOTE — Progress Notes (Signed)
Labs normal except: Your cholesterol levels are elevated.  Work on low cholesterol and lower carbs/sugars diet and  get exercise to try to lower your cholesterol. Consider medications.  Will discuss more at physical unless wanting to start medications now.

## 2022-12-07 DIAGNOSIS — F411 Generalized anxiety disorder: Secondary | ICD-10-CM | POA: Diagnosis not present

## 2022-12-09 ENCOUNTER — Ambulatory Visit (INDEPENDENT_AMBULATORY_CARE_PROVIDER_SITE_OTHER): Payer: 59 | Admitting: Adult Health

## 2022-12-09 ENCOUNTER — Encounter: Payer: Self-pay | Admitting: Adult Health

## 2022-12-09 DIAGNOSIS — F411 Generalized anxiety disorder: Secondary | ICD-10-CM

## 2022-12-09 DIAGNOSIS — F902 Attention-deficit hyperactivity disorder, combined type: Secondary | ICD-10-CM

## 2022-12-09 DIAGNOSIS — F331 Major depressive disorder, recurrent, moderate: Secondary | ICD-10-CM

## 2022-12-09 MED ORDER — FLUOXETINE HCL 20 MG PO CAPS: 20.0000 mg | ORAL_CAPSULE | Freq: Every day | ORAL | 3 refills | Status: DC

## 2022-12-09 MED ORDER — CLONAZEPAM 1 MG PO TABS
1.0000 mg | ORAL_TABLET | Freq: Two times a day (BID) | ORAL | 0 refills | Status: DC | PRN
Start: 2022-12-09 — End: 2023-08-02

## 2022-12-09 NOTE — Progress Notes (Signed)
[name] [name] [id number] 12-13-1960 62 y.o.  Virtual Visit via Telephone Note  I connected with pt on 12/09/22 at  9:00 AM EDT by telephone and verified that I am speaking with the correct person using two identifiers.   I discussed the limitations, risks, security and privacy concerns of performing an evaluation and management service by telephone and the availability of in person appointments. I also discussed with the patient that there may be a patient responsible charge related to this service. The patient expressed understanding and agreed to proceed.   I discussed the assessment and treatment plan with the patient. The patient was provided an opportunity to ask questions and all were answered. The patient agreed with the plan and demonstrated an understanding of the instructions.   The patient was advised to call back or seek an in-person evaluation if the symptoms worsen or if the condition fails to improve as anticipated.  I provided 15 minutes of non-face-to-face time during this encounter.  The patient was located at home.  The provider was located at [facility].   Dorothyann Gibbs, NP    Subjective:   Patient ID:  [name] [name] is a 62 y.o. (DOB 1960/08/20) male.  Chief Complaint: No chief complaint on file.   HPI Gaven Risko presents to the office today for follow-up of GAD, MDD, ADHD.  Describes mood today as "ok". Pleasant. Denies tearfulness. Mood symptoms - denies depression, anxiety, and irritability. Mood is consistent. Stating "I'm doing good". Feels like medications continue to work well. Stable interest and motivation. Taking medications as prescribed. Seeing therapist - Baltazar Apo.  Energy levels stable. Active, has a regular exercise routine. Enjoys some usual interests and activities. Lives with partner. Spending time with family and friends. Appetite adequate. Weight stable. Sleeps well most nights. Averages 6 to 8 hours. Focus  and concentration stable. Completing tasks. Managing aspects of household. Retired a month ago.  Denies SI or HI.  Denies AH or VH.   PHQ2-9    Flowsheet Row Office Visit from 10/18/2022 in [facility]  PHQ-2 Total Score 1  PHQ-9 Total Score 6       Review of Systems:  Review of Systems  Musculoskeletal:  Negative for gait problem.  Neurological:  Negative for tremors.  Psychiatric/Behavioral:         Please refer to HPI    Medications: I have reviewed the patient's current medications.  Current Outpatient Medications  Medication Sig Dispense Refill   cetirizine (ZYRTEC) 5 MG tablet Take 5 mg by mouth daily as needed for allergies.      clonazePAM (KLONOPIN) 1 MG tablet Take 1 tablet (1 mg total) by mouth 2 (two) times daily as needed for anxiety. 180 tablet 0   cyclobenzaprine (FLEXERIL) 10 MG tablet Take 10 mg by mouth 3 (three) times daily as needed for muscle spasms.      diltiazem (CARDIZEM CD) 120 MG 24 hr capsule Take 120 mg by mouth daily.     FLUoxetine (PROZAC) 20 MG capsule Take 1 capsule (20 mg total) by mouth daily. 90 capsule 3   tretinoin (ALTRALIN) 0.05 % gel      No current facility-administered medications for this visit.    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   Please see review of systems for further details on the patient's review from today.   Objective:   Physical Exam:  There were no vitals taken for this visit.  Physical Exam Constitutional:      General: He is not  in acute distress. Musculoskeletal:        General: No deformity.  Neurological:     Mental Status: He is alert and oriented to person, place, and time.     Coordination: Coordination normal.  Psychiatric:        Attention and Perception: Attention and perception normal. He does not perceive auditory or visual hallucinations.        Mood and Affect: Mood normal. Mood is not anxious or depressed. Affect is not labile, blunt, angry or inappropriate.        Speech: Speech normal.        Behavior: Behavior normal.        Thought Content: Thought content normal. Thought content is not paranoid or delusional. Thought content does not include homicidal or suicidal ideation. Thought content does not include homicidal or suicidal plan.        Cognition and Memory: Cognition and memory normal.        Judgment: Judgment normal.     Comments: Insight intact     Lab Review:     Component Value Date/Time   NA 135 10/18/2022 1226   K 4.3 10/18/2022 1226   CL 103 10/18/2022 1226   CO2 28 10/18/2022 1226   GLUCOSE 98 10/18/2022 1226   BUN 13 10/18/2022 1226   CREATININE 0.82 10/18/2022 1226   CALCIUM 9.4 10/18/2022 1226   PROT 6.7 10/18/2022 1226   ALBUMIN 4.3 10/18/2022 1226   AST 14 10/18/2022 1226   ALT 16 10/18/2022 1226   ALKPHOS 77 10/18/2022 1226   BILITOT 0.5 10/18/2022 1226   GFRNONAA >60 06/13/2016 1117   GFRAA >60 06/13/2016 1117       Component Value Date/Time   WBC 7.7 10/18/2022 1226   RBC 5.63 10/18/2022 1226   HGB 16.6 10/18/2022 1226   HGB 17.4 (H) 09/26/2011 1016   HCT 48.3 10/18/2022 1226   HCT 51.1 (H) 09/26/2011 1016   PLT 299.0 10/18/2022 1226   PLT 277 09/26/2011 1016   MCV 85.8 10/18/2022 1226   MCV 87.0 09/26/2011 1016   MCH 30.0 06/13/2016 1117   MCHC 34.4 10/18/2022 1226   RDW 14.0 10/18/2022 1226   RDW 12.6 09/26/2011 1016   LYMPHSABS 2.2 10/18/2022 1226   LYMPHSABS 2.2 09/26/2011 1016   MONOABS 0.6 10/18/2022 1226   MONOABS 0.5 09/26/2011 1016    EOSABS 0.1 10/18/2022 1226   EOSABS 0.0 09/26/2011 1016   BASOSABS 0.0 10/18/2022 1226   BASOSABS 0.1 09/26/2011 1016    No results found for: "POCLITH", "LITHIUM"   No results found for: "PHENYTOIN", "PHENOBARB", "VALPROATE", "CBMZ"   .res Assessment: Plan:    Plan:  Prozac 20mg  daily Clonazepam 1mg  daily  RTC 6 months  Discussed potential benefits, risks, and side effects of stimulants with patient to include increased heart rate, palpitations, insomnia, increased anxiety, increased irritability, or decreased appetite.  Instructed patient to contact office if experiencing any significant tolerability issues.  Discussed potential benefits, risk, and side effects of benzodiazepines to include potential risk of tolerance and dependence, as well as possible drowsiness.  Advised patient not to drive if experiencing drowsiness and to take lowest possible effective dose to minimize risk of dependence and tolerance.  Patient advised to contact office with any questions, adverse effects, or acute worsening in signs and symptoms.  There are no diagnoses linked to this encounter.   Please see After Visit Summary for patient specific instructions.  Future Appointments  Date Time Provider Department Center  12/09/2022  9:00 AM Jamaiyah Pyle, Thereasa Solo, NP [facility] None  01/18/2023  8:30 AM Jeani Sow, MD [facility]    No orders of the defined types were placed in this encounter.   -------------------------------

## 2022-12-26 ENCOUNTER — Other Ambulatory Visit: Payer: Self-pay | Admitting: Family Medicine

## 2022-12-26 MED ORDER — DILTIAZEM HCL ER COATED BEADS 120 MG PO CP24: 120.0000 mg | ORAL_CAPSULE | Freq: Every day | ORAL | 3 refills | Status: DC

## 2023-01-04 DIAGNOSIS — F411 Generalized anxiety disorder: Secondary | ICD-10-CM | POA: Diagnosis not present

## 2023-01-18 ENCOUNTER — Encounter: Payer: Self-pay | Admitting: Family Medicine

## 2023-01-18 ENCOUNTER — Ambulatory Visit: Payer: BC Managed Care – PPO | Admitting: Family Medicine

## 2023-01-18 VITALS — BP 124/76 | HR 83 | Temp 98.1°F | Resp 16 | Ht 69.0 in | Wt 191.1 lb

## 2023-01-18 DIAGNOSIS — Z0000 Encounter for general adult medical examination without abnormal findings: Secondary | ICD-10-CM | POA: Diagnosis not present

## 2023-01-18 DIAGNOSIS — Z23 Encounter for immunization: Secondary | ICD-10-CM

## 2023-01-18 MED ORDER — EZETIMIBE 10 MG PO TABS: 10.0000 mg | ORAL_TABLET | Freq: Every day | ORAL | 1 refills | Status: DC

## 2023-01-18 NOTE — Progress Notes (Signed)
Phone: [phone]   Subjective:  Patient 61 y.o. male presenting for annual physical.  Chief Complaint  Patient presents with   Annual Exam    CPE Not fasting    Annual - Has been trying to walk regularly.  HTN - His blood pressure is well controlled today at 124/76. Taking Diltiazem 120. Has not been monitoring his blood pressure at home due to anxiety.  Diarrhea - has loose bowel movements about every 4 days. Otherwise is having regular bowel movements. Hasn't sch colon yet Depression/Anxiety - Prozac tends to flare after 2 months-yrs. He now alternates between Prozac 20 mg and Effexor every 2 months, which has helped more. Overall his mood is stable.  ADHD - He states his Adderall has not been helping as much. Caffeine tends to help him focus a bit.  Detached retina - He has been doing overall well. Has healed from his surgery.  Concerned about adderall contributing  Immunizations - UTD on shingles, flu, and Covid vaccine. Will receive Tdap vaccine today.  Elevated cholesterol - Cholesterol was elevated in latest lab work from 10/2022. He was previously on a statin many years ago, stopped due to bad side effects. Mother had hyperlipidemia and was on a statin long term, he believes statin caused her to lose weight and get ALS. He is not interested in starting a statin for this reason. He is receptive to starting Zetia.   See problem oriented charting- ROS- ROS: Gen: no fever, chills  Skin: no rash, itching ENT: no ear pain, ear drainage, nasal congestion, rhinorrhea, sinus pressure, sore throat Eyes: no blurry vision, double vision Resp: no cough, wheeze,SOB CV: no CP, palpitations, LE edema,  GI: no heartburn, n/v/c, abd pain. + Diarrhea (see HPI) GU: no dysuria, urgency, frequency, hematuria MSK: no joint pain, myalgias, back pain Neuro: no dizziness, headache, weakness, vertigo Psych: no insomnia, SI. 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      clonazePAM (KLONOPIN) 1 MG tablet Take 1 tablet (1 mg total) by mouth 2 (two) times daily as needed for anxiety. 180 tablet 0   cyclobenzaprine (FLEXERIL) 10 MG tablet Take 10 mg by mouth 3 (three) times daily as needed for muscle spasms.      diltiazem (CARDIZEM CD) 120 MG 24 hr capsule Take 1 capsule (120 mg total) by mouth daily. 90 capsule 3   ezetimibe (ZETIA) 10 MG tablet Take 1 tablet (10 mg total) by mouth daily.  90 tablet 1   FLUoxetine (PROZAC) 20 MG capsule Take 1 capsule (20 mg total) by mouth daily. 90 capsule 3   latanoprost (XALATAN) 0.005 % ophthalmic solution SMARTSIG:1 Drop(s) Left Eye Every Evening     tretinoin (ALTRALIN) 0.05 % gel      No current facility-administered medications for this visit.    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 Left nystagmus.  NECK:  supple, no thyromegaly, no nodes, no carotid bruits CARDIAC: RRR, S1S2+, no murmur. DP 2+B LUNGS: CTAB. No wheezes ABDOMEN:  BS+, soft, NTND, No HSM, no masses EXT:  no edema MSK: no gross abnormalities. MS 5/5 all 4 NEURO: A&O x3.  CN II-XII intact. Left facial tremor.  PSYCH: normal mood. Good eye contact     Assessment and Plan   Health Maintenance counseling: 1. Anticipatory guidance: Patient counseled regarding regular dental exams q6 months, eye exams yearly, avoiding smoking and second hand smoke, limiting alcohol to 2 beverages per day.   2. Risk factor reduction:  Advised patient of need for regular exercise and diet rich in fruits and vegetables to reduce risk of heart attack and stroke. Exercise- tries to walk regularly.   Wt Readings from Last 3 Encounters:  01/18/23 191 lb 2 oz (86.7 kg)  10/18/22 188 lb 6 oz (85.4 kg)  09/26/11 213 lb 3.2 oz (96.7 kg)   3. Immunizations/screenings/ancillary studies Immunization History  Administered Date(s) Administered   DTaP, 5 pertussis antigens 07/16/2018   Influenza Split 04/18/2011   Influenza, Quadrivalent, Recombinant, Inj, Pf 03/28/2019   Influenza,inj,Quad PF,6+ Mos 04/07/2015   Influenza-Unspecified 04/23/2017, 03/25/2021, 03/24/2022   Moderna Covid-19 Vaccine Bivalent Booster 59yrs & up 11/10/2021   PFIZER(Purple Top)SARS-COV-2 Vaccination 10/03/2019, 10/23/2019, 11/15/2019, 04/23/2020, 10/29/2020, 03/25/2021   Pneumococcal Conjugate-13 05/31/2017   Pneumococcal Polysaccharide-23 09/16/1999, 09/15/2005, 07/05/2018   RSV,unspecified 03/24/2022   Tdap 10/17/2007, 01/18/2023   Unspecified SARS-COV-2 Vaccination 04/01/2022   Zoster Recombinant(Shingrix)  11/17/2021, 01/28/2022   Health Maintenance Due  Topic Date Due   HIV Screening  Never done   Hepatitis C Screening  Never done   Colonoscopy  01/31/2021    4. Prostate cancer screening >55yo - risk factors? No results found for: "PSA"  5. Colon cancer screening: will schedule colonoscopy soon 6. Skin cancer screening- Plans to schedule a visit with dermatology. Advised regular sunscreen use. Denies worrisome, changing, or new skin lesions.    Wellness examination  Other orders -     Tdap vaccine greater than or equal to 7yo IM -     Ezetimibe; Take 1 tablet (10 mg total) by mouth daily.  Dispense: 90 tablet; Refill: 1   Wellniess-antic guidance.  Tdap given HLD-pt concerned w/statins.  Will do zetia 10mg  daily  Recommended follow up: Return in about 6 months (around 07/21/2023) for HTN, cholesterol.  Lab/Order associations:not fasting    I,Rachel Rivera,acting as a scribe for  Angelena Sole, MD.,have documented all relevant documentation on the behalf of Angelena Sole, MD,as directed by  Angelena Sole, MD while in the presence of Angelena Sole, MD.  I, Angelena Sole, MD, have reviewed all documentation for this visit. The documentation on 01/18/23 for the exam, diagnosis, procedures, and orders are all accurate and complete.  IAngelena Sole, MD, have reviewed all documentation for this visit. The documentation on 01/18/23 for the exam, diagnosis, procedures, and orders are all accurate and complete.   Angelena Sole, MD

## 2023-01-18 NOTE — Patient Instructions (Signed)
It was very nice to see you today!  Puzzles Call dermatologist Call colonoscopy exercise   PLEASE NOTE:  If you had any lab tests please let us know if you have not heard back within a few days. You may see your results on MyChart before we have a chance to review them but we will give you a call once they are reviewed by [place]. If we ordered any referrals today, please let us know if you have not heard from their office within the next week.   Please try these tips to maintain a healthy lifestyle:  Eat most of your calories during the day when you are active. Eliminate processed foods including packaged sweets (pies, cakes, cookies), reduce intake of potatoes, white bread, white pasta, and white rice. Look for whole grain options, oat flour or almond flour.  Each meal should contain half fruits/vegetables, one quarter protein, and one quarter carbs (no bigger than a computer mouse).  Cut down on sweet beverages. This includes juice, soda, and sweet tea. Also watch fruit intake, though this is a healthier sweet option, it still contains natural sugar! Limit to 3 servings daily.  Drink at least 1 glass of water with each meal and aim for at least 8 glasses per day  Exercise at least 150 minutes every week.

## 2023-02-01 DIAGNOSIS — F411 Generalized anxiety disorder: Secondary | ICD-10-CM | POA: Diagnosis not present

## 2023-02-09 DIAGNOSIS — H211X2 Other vascular disorders of iris and ciliary body, left eye: Secondary | ICD-10-CM | POA: Diagnosis not present

## 2023-02-09 DIAGNOSIS — H35342 Macular cyst, hole, or pseudohole, left eye: Secondary | ICD-10-CM | POA: Diagnosis not present

## 2023-02-09 DIAGNOSIS — H3342 Traction detachment of retina, left eye: Secondary | ICD-10-CM | POA: Diagnosis not present

## 2023-02-09 DIAGNOSIS — H4050X2 Glaucoma secondary to other eye disorders, unspecified eye, moderate stage: Secondary | ICD-10-CM | POA: Diagnosis not present

## 2023-03-08 DIAGNOSIS — H4050X2 Glaucoma secondary to other eye disorders, unspecified eye, moderate stage: Secondary | ICD-10-CM | POA: Diagnosis not present

## 2023-03-08 DIAGNOSIS — H211X2 Other vascular disorders of iris and ciliary body, left eye: Secondary | ICD-10-CM | POA: Diagnosis not present

## 2023-03-08 DIAGNOSIS — H35342 Macular cyst, hole, or pseudohole, left eye: Secondary | ICD-10-CM | POA: Diagnosis not present

## 2023-03-08 DIAGNOSIS — F411 Generalized anxiety disorder: Secondary | ICD-10-CM | POA: Diagnosis not present

## 2023-03-08 DIAGNOSIS — H4720 Unspecified optic atrophy: Secondary | ICD-10-CM | POA: Diagnosis not present

## 2023-03-29 DIAGNOSIS — H35342 Macular cyst, hole, or pseudohole, left eye: Secondary | ICD-10-CM | POA: Diagnosis not present

## 2023-03-29 DIAGNOSIS — F411 Generalized anxiety disorder: Secondary | ICD-10-CM | POA: Diagnosis not present

## 2023-03-29 DIAGNOSIS — H3342 Traction detachment of retina, left eye: Secondary | ICD-10-CM | POA: Diagnosis not present

## 2023-03-29 DIAGNOSIS — H4050X2 Glaucoma secondary to other eye disorders, unspecified eye, moderate stage: Secondary | ICD-10-CM | POA: Diagnosis not present

## 2023-03-29 DIAGNOSIS — H211X2 Other vascular disorders of iris and ciliary body, left eye: Secondary | ICD-10-CM | POA: Diagnosis not present

## 2023-04-26 DIAGNOSIS — F411 Generalized anxiety disorder: Secondary | ICD-10-CM | POA: Diagnosis not present

## 2023-05-03 DIAGNOSIS — H4720 Unspecified optic atrophy: Secondary | ICD-10-CM | POA: Diagnosis not present

## 2023-05-03 DIAGNOSIS — H211X2 Other vascular disorders of iris and ciliary body, left eye: Secondary | ICD-10-CM | POA: Diagnosis not present

## 2023-05-03 DIAGNOSIS — H35342 Macular cyst, hole, or pseudohole, left eye: Secondary | ICD-10-CM | POA: Diagnosis not present

## 2023-05-03 DIAGNOSIS — H26492 Other secondary cataract, left eye: Secondary | ICD-10-CM | POA: Diagnosis not present

## 2023-05-03 DIAGNOSIS — H4050X2 Glaucoma secondary to other eye disorders, unspecified eye, moderate stage: Secondary | ICD-10-CM | POA: Diagnosis not present

## 2023-05-24 DIAGNOSIS — F411 Generalized anxiety disorder: Secondary | ICD-10-CM | POA: Diagnosis not present

## 2023-07-05 DIAGNOSIS — H26492 Other secondary cataract, left eye: Secondary | ICD-10-CM | POA: Diagnosis not present

## 2023-07-05 DIAGNOSIS — H4050X2 Glaucoma secondary to other eye disorders, unspecified eye, moderate stage: Secondary | ICD-10-CM | POA: Diagnosis not present

## 2023-07-05 DIAGNOSIS — H211X2 Other vascular disorders of iris and ciliary body, left eye: Secondary | ICD-10-CM | POA: Diagnosis not present

## 2023-07-05 DIAGNOSIS — H35342 Macular cyst, hole, or pseudohole, left eye: Secondary | ICD-10-CM | POA: Diagnosis not present

## 2023-07-06 ENCOUNTER — Other Ambulatory Visit: Payer: Self-pay | Admitting: Family Medicine

## 2023-07-06 ENCOUNTER — Other Ambulatory Visit: Payer: Self-pay | Admitting: *Deleted

## 2023-07-06 ENCOUNTER — Encounter: Payer: Self-pay | Admitting: Family Medicine

## 2023-07-06 MED ORDER — CYCLOBENZAPRINE HCL 10 MG PO TABS: 10.0000 mg | ORAL_TABLET | Freq: Three times a day (TID) | ORAL | 3 refills | Status: AC | PRN

## 2023-07-06 MED ORDER — TRETINOIN 0.05 % EX GEL: 1.0000 | Freq: Every day | CUTANEOUS | 3 refills | Status: DC

## 2023-07-06 MED ORDER — TOPIRAMATE 25 MG PO TABS: 25.0000 mg | ORAL_TABLET | Freq: Every evening | ORAL | 0 refills | Status: DC

## 2023-07-21 ENCOUNTER — Encounter: Payer: Self-pay | Admitting: Family Medicine

## 2023-07-21 ENCOUNTER — Ambulatory Visit (INDEPENDENT_AMBULATORY_CARE_PROVIDER_SITE_OTHER): Payer: BC Managed Care – PPO | Admitting: Family Medicine

## 2023-07-21 VITALS — BP 124/85 | HR 107 | Temp 98.1°F | Resp 18 | Ht 69.0 in | Wt 198.4 lb

## 2023-07-21 DIAGNOSIS — E7800 Pure hypercholesterolemia, unspecified: Secondary | ICD-10-CM | POA: Insufficient documentation

## 2023-07-21 DIAGNOSIS — F418 Other specified anxiety disorders: Secondary | ICD-10-CM

## 2023-07-21 DIAGNOSIS — G250 Essential tremor: Secondary | ICD-10-CM

## 2023-07-21 DIAGNOSIS — I10 Essential (primary) hypertension: Secondary | ICD-10-CM

## 2023-07-21 LAB — COMPREHENSIVE METABOLIC PANEL
ALT: 24 U/L (ref 0–53)
AST: 20 U/L (ref 0–37)
Albumin: 4.5 g/dL (ref 3.5–5.2)
Alkaline Phosphatase: 74 U/L (ref 39–117)
BUN: 15 mg/dL (ref 6–23)
CO2: 24 meq/L (ref 19–32)
Calcium: 9.5 mg/dL (ref 8.4–10.5)
Chloride: 105 meq/L (ref 96–112)
Creatinine, Ser: 0.92 mg/dL (ref 0.40–1.50)
GFR: 88.75 mL/min (ref >60.00)
Glucose, Bld: 98 mg/dL (ref 70–99)
Potassium: 4.4 meq/L (ref 3.5–5.1)
Sodium: 138 meq/L (ref 135–145)
Total Bilirubin: 0.5 mg/dL (ref 0.2–1.2)
Total Protein: 7.4 g/dL (ref 6.0–8.3)

## 2023-07-21 LAB — CBC WITH DIFFERENTIAL/PLATELET
Basophils Absolute: 0 10*3/uL (ref 0.0–0.1)
Basophils Relative: 0.3 % (ref 0.0–3.0)
Eosinophils Absolute: 0.1 10*3/uL (ref 0.0–0.7)
Eosinophils Relative: 0.7 % (ref 0.0–5.0)
HCT: 49.3 % (ref 39.0–52.0)
Hemoglobin: 16.7 g/dL (ref 13.0–17.0)
Lymphocytes Relative: 12.6 % (ref 12.0–46.0)
Lymphs Abs: 1.6 10*3/uL (ref 0.7–4.0)
MCHC: 33.9 g/dL (ref 30.0–36.0)
MCV: 90.1 fL (ref 78.0–100.0)
Monocytes Absolute: 0.9 10*3/uL (ref 0.1–1.0)
Monocytes Relative: 7.3 % (ref 3.0–12.0)
Neutro Abs: 10.3 10*3/uL — ABNORMAL HIGH (ref 1.4–7.7)
Neutrophils Relative %: 79.1 % — ABNORMAL HIGH (ref 43.0–77.0)
Platelets: 296 10*3/uL (ref 150.0–400.0)
RBC: 5.48 Mil/uL (ref 4.22–5.81)
RDW: 13.3 % (ref 11.5–15.5)
WBC: 13 10*3/uL — ABNORMAL HIGH (ref 4.0–10.5)

## 2023-07-21 LAB — LIPID PANEL
Cholesterol: 204 mg/dL — ABNORMAL HIGH (ref 0–200)
HDL: 49.6 mg/dL (ref >39.00)
LDL Cholesterol: 131 mg/dL — ABNORMAL HIGH (ref 0–99)
NonHDL: 154.88
Total CHOL/HDL Ratio: 4
Triglycerides: 120 mg/dL (ref 0.0–149.0)
VLDL: 24 mg/dL (ref 0.0–40.0)

## 2023-07-21 MED ORDER — TOPIRAMATE 25 MG PO TABS: 25.0000 mg | ORAL_TABLET | Freq: Every evening | ORAL | 1 refills | Status: DC

## 2023-07-21 MED ORDER — EZETIMIBE 10 MG PO TABS: 10.0000 mg | ORAL_TABLET | Freq: Every day | ORAL | 1 refills | Status: DC

## 2023-07-21 NOTE — Assessment & Plan Note (Signed)
 Chronic.  ?control  on zetia 10mg  daily and tolerating(intol statins).

## 2023-07-21 NOTE — Patient Instructions (Signed)
It was very nice to see you today!  Happy New Year!   PLEASE NOTE:  If you had any lab tests please let us know if you have not heard back within a few days. You may see your results on MyChart before we have a chance to review them but we will give you a call once they are reviewed by us. If we ordered any referrals today, please let us know if you have not heard from their office within the next week.   Please try these tips to maintain a healthy lifestyle:  Eat most of your calories during the day when you are active. Eliminate processed foods including packaged sweets (pies, cakes, cookies), reduce intake of potatoes, white bread, white pasta, and white rice. Look for whole grain options, oat flour or almond flour.  Each meal should contain half fruits/vegetables, one quarter protein, and one quarter carbs (no bigger than a computer mouse).  Cut down on sweet beverages. This includes juice, soda, and sweet tea. Also watch fruit intake, though this is a healthier sweet option, it still contains natural sugar! Limit to 3 servings daily.  Drink at least 1 glass of water with each meal and aim for at least 8 glasses per day  Exercise at least 150 minutes every week.   

## 2023-07-21 NOTE — Assessment & Plan Note (Signed)
 Chronic.  Controlled.  Continue diltiazem 120mg  daily

## 2023-07-21 NOTE — Assessment & Plan Note (Signed)
 Chronic.  Intol beta blockers.  Improved on topamax 25mg  daily.  If worse, will increase to 50mg 

## 2023-07-21 NOTE — Assessment & Plan Note (Signed)
 Chronic.  Controlled on prozac 20mg  and Klonopin 1mg  bid.  Will xfer from psych to me after next visit w/psych

## 2023-07-21 NOTE — Progress Notes (Signed)
 Subjective:     Patient ID: [name] [name], male    DOB: 11-18-1960, 63 y.o.   MRN: [id number]  Chief Complaint  Patient presents with   Medical Management of Chronic Issues    6 month follow-up on htn, cholesterol Chest congestionn/cold, sx started about 2 days ago Fasting     HPI 1.  HYPERTENSION-on  diltiazem  120.doesn't check as makes him anxious.  Working on exercise.  No ha/dizziness/cp/palp/edema/cough/sob  2.  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  Dr. Elner now.  Ocular pressure increased so started on drops  3.  Depression/anxiety-seeing therapist and psychiatrist-Prozac  doing well x after 2 years, will need to change for short time then back to prozac .  Depression cyclical-about every 2 years flares.  Doing well now. No SI.  Stable on meds  will stop seeing psych d/t cost so will xfer meds to me. 4.   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 Has tried several beta blocker-all cause diarrhea or GI upset.  doing better on topamax . 5.  GERD-history of dilitations in past. Doing well 6. URI for 2 days-[name] had as well.  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 , Disp: , Rfl:    clonazePAM  (KLONOPIN ) 1 MG tablet, Take 1 tablet (1 mg total) by mouth 2 (two) times  daily as needed for anxiety., Disp: 180 tablet, Rfl: 0   cyclobenzaprine  (FLEXERIL ) 10 MG tablet, Take 1 tablet (10 mg total) by mouth 3 (three) times daily as needed for muscle spasms., Disp: 45 tablet, Rfl: 3   diltiazem  (CARDIZEM  CD) 120 MG 24 hr capsule, Take 1 capsule (120 mg total) by mouth daily., Disp: 90 capsule, Rfl: 3   FLUoxetine  (PROZAC ) 20 MG capsule, Take 1 capsule (20 mg total) by mouth daily., Disp: 90 capsule, Rfl: 3   fluticasone (FLONASE) 50 MCG/ACT nasal spray, as needed., Disp: , Rfl:    latanoprost (XALATAN) 0.005 % ophthalmic solution, SMARTSIG:1 Drop(s) Left Eye Every Evening, Disp: , Rfl:    timolol (TIMOPTIC) 0.5 % ophthalmic solution, Place 1 drop into the left eye every morning., Disp: , Rfl:    tretinoin  (ALTRALIN) 0.05 % gel, Apply 1 Application topically at bedtime., Disp: 45 g, Rfl: 3   ezetimibe  (ZETIA ) 10 MG tablet, Take 1 tablet (10 mg total) by mouth daily., Disp: 90 tablet, Rfl: 1   topiramate  (TOPAMAX ) 25 MG tablet, Take 1 tablet (25 mg total) by mouth at bedtime., Disp: 90 tablet, Rfl: 1  Allergies  Allergen Reactions   Telithromycin Other (See Comments)    Other reaction(s): Unknown   ROS neg/noncontributory except as noted HPI/below      Objective:  BP 124/85   Pulse (!) 107   Temp 98.1 F (36.7 C) (Temporal)   Resp 18   Ht 5' 9 (1.753 m)   Wt 198 lb 6 oz (90 kg)   SpO2 96%   BMI 29.29 kg/m  Wt Readings from Last 3 Encounters:  07/21/23 198 lb 6 oz (90 kg)  01/18/23 191 lb 2 oz (86.7 kg)  10/18/22 188 lb 6 oz (85.4 kg)    Physical Exam   Gen: WDWN NAD HEENT: NCAT, conjunctiva not injected, sclera nonicteric TM WNL B, OP moist, no exudates  NECK:  supple, no thyromegaly, no nodes, no carotid bruits CARDIAC: tachy RRR, S1S2+, no murmur. DP 2+B LUNGS: CTAB. No wheezes ABDOMEN:  BS+, soft, NTND, No HSM, no masses EXT:  no edema MSK: no gross abnormalities.  NEURO: A&O x3.  CN II-XII intact. Mild tremor PSYCH: normal mood.  Good eye contact     Assessment & Plan:  Primary hypertension Assessment & Plan: Chronic.  Controlled.  Continue diltiazem  120mg  daily  Orders: -     Comprehensive metabolic panel -     Lipid panel -     CBC with Differential/Platelet  Benign familial tremor Assessment & Plan: Chronic.  Intol beta blockers.  Improved on topamax  25mg  daily.  If worse, will increase to 50mg   Orders: -     Topiramate ; Take 1 tablet (25 mg total) by mouth at bedtime.  Dispense: 90 tablet; Refill: 1  Anxiety with depression Assessment & Plan: Chronic.  Controlled on prozac  20mg  and Klonopin  1mg  bid.  Will xfer from psych to me after next visit w/psych   Pure hypercholesterolemia Assessment & Plan: Chronic.  ?control  on zetia  10mg  daily and tolerating(intol statins).    Other orders -     Ezetimibe ; Take 1 tablet (10 mg total) by mouth daily.  Dispense: 90 tablet; Refill: 1    Return in about 6 months (around 01/18/2024) for chronic follow-up.  Jenkins CHRISTELLA Carrel, MD

## 2023-07-21 NOTE — Progress Notes (Deleted)
 New Patient Office Visit  Subjective:  Patient ID: [name] [name], male    DOB: [birth year]  Age: 63 y.o. MRN: [id number]  CC:  Chief Complaint  Patient presents with   Medical Management of Chronic Issues    6 month follow-up on htn, cholesterol Chest congestionn/cold, sx started about 2 days ago Fasting     HPI [name] [name] presents ff/u.  Here w/partner [name] [name]    Detached retina-fall 2022-bleed behind left eye-retinal-lasered and retina detached Jan 2023 and surgery, then macular hole, then cataracts  still vision issues.  [name] [name] now.  Ocular pressure increased so started on drops 1 week(s) ago and mult SE so stopped Depression/anxiety-seeing therapist and psychiatrist-Prozac  doing well x after 2 years, will need to change for short time then back to prozac .  Depression cyclical-about every 2 years flares.  Doing well now. No SI.  Stable on meds  will stop seeing psych HYPERTENSION-on  diltiazem  120.doesn't check as makes him anxious.  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 Has tried several beta blocker-all cause diarrhea(but still w/diarrhea off them).  Tremors aren't bad enough to stay on meds.  Had gained weight during 2020 30# but losing now-diarrhea GERD-history of dilitations in past. Doing well Tremors-doing better on topamax .  Off BB as all cause GI upset URI for 2 days.   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    Past Surgical History:  Procedure Laterality Date   EYE SURGERY  4 surgeries in 2022 & 2023   Detached retina   SEPTOPLASTY      Family History  Problem Relation Age of Onset   Heart disease Mother 33   Hyperlipidemia Mother    ALS Mother     Social History   Socioeconomic History    Marital status: Single    Spouse name: [name] [name]   Number of children: 0   Years of education: Not on file   Highest education level: Not on file  Occupational History   Not on file  Tobacco Use   Smoking status: Never   Smokeless tobacco: Never  Vaping Use   Vaping status: Never Used  Substance and Sexual Activity   Alcohol use: Yes   Drug use: No   Sexual activity: Not on file  Other Topics Concern   Not on file  Social History Narrative   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  Mult studies and documents.  Ibu frequency in past, but not now-just tylenol.  Neuro: no dizziness, headache, weakness, vertigo Psych:HPI  Objective:   Today's Vitals: BP  124/85   Pulse (!) 107   Temp 98.1 F (36.7 C) (Temporal)   Resp 18   Ht 5' 9 (1.753 m)   Wt 198 lb 6 oz (90 kg)   SpO2 96%   BMI 29.29 kg/m   Physical Exam  Gen: WDWN NAD HEENT: NCAT, conjunctiva injected L, sclera nonicteric . Nystagmus R NECK:  supple, no thyromegaly, no nodes, no carotid bruits CARDIAC: RRR, S1S2+, no murmur. DP 2+B LUNGS: CTAB. No wheezes ABDOMEN:  BS+, soft, NTND, No HSM, no masses EXT:  no edema MSK: no gross abnormalities.  NEURO: A&O x3.  CN II-XII intact.  PSYCH: normal mood. Good eye contact   Assessment & Plan:   Problem List Items Addressed This Visit       Cardiovascular and  Mediastinum   Primary hypertension - Primary   Relevant Medications   carvedilol (COREG) 12.5 MG tablet  1.  Hypertension-chronic.  Controlled.  Continue Cardizem  CD 120 mg daily.  Check CMP, CBC, A1c, lipids 2.  Diarrhea-for 1 year.  Patient thought due to beta-blockers, however he has been off of all beta-blockers for 3 months and still has diarrhea.  Will check CBC, CMP, TSH, sed rate, CRP.  Refer to GI. 3.  Detached retina left-has had several surgeries.  Has lost a lot of vision.  Currently stable.  Managed by retinologist [name] [name]. 4.  Depression/anxiety-chronic.  Well-controlled.  Continue Prozac  20 mg daily takes clonazepam  1 mg at at bedtime and occasionally daytime.  Managed by psych and counseling. 5.  Benign familial tremor-side effects to beta-blockers.  He is doing okay without them.  For now, no treatment.  If he is having a really bad day, he can try half of a clonazepam  (advised not to take this regularly) consider other medications if becomes problematic 6.  GERD-history of dilatations in the past.  Not taking any medications.  Doing well.  Follow-up annual physical in 3 months  Outpatient Encounter Medications as of 07/21/2023  Medication Sig   carvedilol (COREG) 12.5 MG tablet as needed.   cetirizine (ZYRTEC) 5 MG tablet Take 5 mg by mouth daily as needed for allergies.    clonazePAM  (KLONOPIN ) 1 MG tablet Take 1 tablet (1 mg total) by mouth 2 (two) times daily as needed for anxiety.   cyclobenzaprine  (FLEXERIL ) 10 MG tablet Take 1 tablet (10 mg total) by mouth 3 (three) times daily as needed for muscle spasms.   diltiazem  (CARDIZEM  CD) 120 MG 24 hr capsule Take 1 capsule (120 mg total) by mouth daily.   ezetimibe  (ZETIA ) 10 MG tablet Take 1 tablet (10 mg total) by mouth daily.   FLUoxetine  (PROZAC ) 20 MG capsule Take 1 capsule (20 mg total) by mouth daily.   fluticasone (FLONASE) 50 MCG/ACT nasal spray as needed.   latanoprost (XALATAN) 0.005 % ophthalmic solution  SMARTSIG:1 Drop(s) Left Eye Every Evening   timolol (TIMOPTIC) 0.5 % ophthalmic solution Place 1 drop into the left eye every morning.   topiramate  (TOPAMAX ) 25 MG tablet Take 1 tablet (25 mg total) by mouth at bedtime.   tretinoin  (ALTRALIN) 0.05 % gel Apply 1 Application topically at bedtime.   [DISCONTINUED] omega-3 acid ethyl esters (LOVAZA) 1 G capsule Take 2 g by mouth 2 (two) times daily.     No facility-administered encounter medications on file as of 07/21/2023.    Follow-up: No follow-ups on file.   Jenkins CHRISTELLA Carrel, MD HPI

## 2023-07-23 NOTE — Progress Notes (Signed)
 Cholesterol better w/zetia-continue.  Still needs some work(diet/exercise) Wbc elevated-probably from sickness-hopefully feeling better.  Can repeat 3-4 wks if wants to make sure normal Rest of labs ok

## 2023-07-27 ENCOUNTER — Telehealth: Payer: Self-pay

## 2023-07-27 ENCOUNTER — Other Ambulatory Visit (HOSPITAL_COMMUNITY): Payer: Self-pay

## 2023-07-27 NOTE — Telephone Encounter (Signed)
 In order for [place] to complete the PA I need the diagnosis code for Tretinoin 0.05% gel.

## 2023-07-28 ENCOUNTER — Encounter: Payer: Self-pay | Admitting: *Deleted

## 2023-07-28 NOTE — Telephone Encounter (Signed)
 Spoke to patient and he stated that he uses cream for black heads, has really big pores and was prescribed by a previous doctor.

## 2023-07-28 NOTE — Telephone Encounter (Signed)
 Left message to return my call.

## 2023-08-02 ENCOUNTER — Telehealth (INDEPENDENT_AMBULATORY_CARE_PROVIDER_SITE_OTHER): Payer: BC Managed Care – PPO | Admitting: Adult Health

## 2023-08-02 ENCOUNTER — Encounter: Payer: Self-pay | Admitting: Adult Health

## 2023-08-02 DIAGNOSIS — F411 Generalized anxiety disorder: Secondary | ICD-10-CM

## 2023-08-02 DIAGNOSIS — F331 Major depressive disorder, recurrent, moderate: Secondary | ICD-10-CM | POA: Diagnosis not present

## 2023-08-02 MED ORDER — FLUOXETINE HCL 20 MG PO CAPS: 20.0000 mg | ORAL_CAPSULE | Freq: Every day | ORAL | 3 refills | Status: AC

## 2023-08-02 MED ORDER — CLONAZEPAM 1 MG PO TABS: 1.0000 mg | ORAL_TABLET | Freq: Two times a day (BID) | ORAL | 0 refills | Status: DC | PRN

## 2023-08-02 NOTE — Progress Notes (Signed)
 [name] [name] [id number] 1961/02/23 63 y.o.  Virtual Visit via Video Note  I connected with pt @ on 08/02/23 at  2:30 PM EST by a video enabled telemedicine application and verified that I am speaking with the correct person using two identifiers.   I discussed the limitations of evaluation and management by telemedicine and the availability of in person appointments. The patient expressed understanding and agreed to proceed.  I discussed the assessment and treatment plan with the patient. The patient was provided an opportunity to ask questions and all were answered. The patient agreed with the plan and demonstrated an understanding of the instructions.   The patient was advised to call back or seek an in-person evaluation if the symptoms worsen or if the condition fails to improve as anticipated.  I provided 10 minutes of non-face-to-face time during this encounter.  The patient was located at home.  The provider was located at [facility].   Reagan Camera, NP   Subjective:   Patient ID:  [name] [name] is a 63 y.o. (DOB [birth year]) male.  Chief Complaint: No chief complaint on file.   HPI [name] [name] presents for follow-up of GAD and MDD.   Describes mood today as "ok". Pleasant. Denies tearfulness. Mood symptoms - reports some depression, anxiety, and irritability - "under control most days". Reports panic attacks - "only with certain triggers". Reports some worry, rumination and over thinking. Mood is consistent. Stating "I feel like I'm doing good". Feels like medications continue to work well. Stable interest and motivation. Taking medications as prescribed. Seeing therapist - Jerryl Morin.  Energy levels stable. Active, has a regular exercise routine. Enjoys some usual interests and activities. Lives with partner. Spending time with family and friends. Appetite adequate. Reports weight loss. Sleeps well most nights. Averages 6 to 8 hours. Focus and  concentration stable. Completing tasks. Managing aspects of household. Retired. Denies SI or HI.  Denies AH or VH. Denies self harm. Denies substance use.   Review of Systems:  Review of Systems  Musculoskeletal:  Negative for gait problem.  Neurological:  Negative for tremors.  Psychiatric/Behavioral:         Please refer to HPI    Medications: I have reviewed the patient's current medications.  Current Outpatient Medications  Medication Sig Dispense Refill   cetirizine (ZYRTEC) 5 MG tablet Take 5 mg by mouth daily as needed for allergies.      clonazePAM  (KLONOPIN ) 1 MG tablet Take 1 tablet (1 mg total) by mouth 2 (two) times daily as needed for anxiety. 180 tablet 0   cyclobenzaprine  (FLEXERIL ) 10 MG tablet Take 1 tablet (10 mg total) by mouth 3 (three) times daily as needed for muscle spasms. 45 tablet 3   diltiazem  (CARDIZEM  CD) 120 MG 24 hr capsule Take 1 capsule (120 mg total) by mouth daily. 90 capsule 3   ezetimibe  (ZETIA ) 10 MG tablet Take 1 tablet (10 mg total) by mouth daily. 90 tablet 1   FLUoxetine  (PROZAC ) 20 MG capsule Take 1 capsule (20 mg total) by mouth daily. 90 capsule 3   fluticasone (FLONASE) 50 MCG/ACT nasal spray as needed.     latanoprost (XALATAN) 0.005 % ophthalmic solution SMARTSIG:1 Drop(s) Left Eye Every Evening     timolol (TIMOPTIC) 0.5 % ophthalmic solution Place 1 drop into the left eye every morning.     topiramate  (TOPAMAX ) 25 MG tablet Take 1 tablet (25 mg total) by mouth at bedtime. 90 tablet 1   tretinoin  (ALTRALIN) 0.05 %  gel Apply 1 Application topically at bedtime. 45 g 3   No current facility-administered medications for this visit.    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    Family History  Problem Relation Age of Onset   Heart disease Mother 26   Hyperlipidemia Mother    ALS Mother     Social History   Socioeconomic History   Marital status: Single    Spouse name: [name]   Number of children: 0   Years of education: Not on file   Highest education level: Not on file  Occupational History   Not on file  Tobacco Use   Smoking status: Never   Smokeless tobacco: Never  Vaping Use   Vaping status: Never Used  Substance and Sexual Activity   Alcohol use: Yes   Drug use: No   Sexual activity: Not on file  Other Topics Concern   Not on file  Social History Narrative   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   Please see review of systems for further details on the patient's review from today.   Objective:   Physical Exam:  There were no vitals taken for this visit.  Physical Exam Constitutional:      General: He is not in acute distress. Musculoskeletal:        General: No deformity.  Neurological:     Mental Status: He is alert and oriented to person, place, and time.     Coordination: Coordination normal.  Psychiatric:        Attention and Perception: Attention and perception normal. He does not perceive auditory or visual hallucinations.        Mood and Affect: Affect is not labile, blunt, angry or inappropriate.        Speech: Speech normal.        Behavior: Behavior normal.        Thought Content: Thought content normal. Thought content is  not paranoid or delusional. Thought content does not include homicidal or suicidal ideation. Thought content does not include homicidal or suicidal plan.        Cognition and Memory: Cognition and memory normal.        Judgment: Judgment normal.     Comments: Insight intact     Lab Review:     Component Value Date/Time   NA 138 07/21/2023 0855   K 4.4 07/21/2023 0855   CL 105 07/21/2023 0855   CO2 24 07/21/2023 0855   GLUCOSE 98 07/21/2023 0855   BUN 15 07/21/2023 0855   CREATININE 0.92 07/21/2023 0855   CALCIUM 9.5 07/21/2023 0855   PROT 7.4 07/21/2023 0855   ALBUMIN  4.5 07/21/2023 0855   AST 20 07/21/2023 0855   ALT 24 07/21/2023 0855   ALKPHOS 74 07/21/2023 0855   BILITOT 0.5 07/21/2023 0855   GFRNONAA >60 06/13/2016 1117   GFRAA >60 06/13/2016 1117       Component Value Date/Time   WBC 13.0 (H) 07/21/2023 0855   RBC 5.48 07/21/2023 0855   HGB 16.7 07/21/2023 0855   HGB 17.4 (H) 09/26/2011 1016   HCT 49.3 07/21/2023 0855   HCT 51.1 (H) 09/26/2011 1016   PLT 296.0 07/21/2023 0855   PLT 277 09/26/2011 1016   MCV 90.1 07/21/2023 0855   MCV 87.0 09/26/2011 1016   MCH 30.0 06/13/2016 1117   MCHC 33.9 07/21/2023 0855   RDW 13.3 07/21/2023 0855   RDW 12.6 09/26/2011 1016   LYMPHSABS 1.6 07/21/2023 0855   LYMPHSABS 2.2 09/26/2011 1016   MONOABS 0.9 07/21/2023 0855   MONOABS 0.5 09/26/2011 1016   EOSABS 0.1 07/21/2023 0855   EOSABS 0.0 09/26/2011 1016   BASOSABS 0.0 07/21/2023 0855   BASOSABS 0.1 09/26/2011 1016    No results found for: "POCLITH", "LITHIUM"   No results found for: "PHENYTOIN", "PHENOBARB", "VALPROATE", "CBMZ"   .res Assessment: Plan:    Plan:  Prozac  20mg  daily Clonazepam  1mg  daily  RTC 6 months  Discussed potential benefits, risks, and side effects of stimulants with patient to include increased heart rate, palpitations, insomnia, increased anxiety, increased irritability, or decreased appetite.  Instructed patient to contact office if  experiencing any significant tolerability issues.  Discussed potential benefits, risk, and side effects of benzodiazepines to include potential risk of tolerance and dependence, as well as possible drowsiness.  Advised patient not to drive if experiencing drowsiness and to take lowest possible effective dose to minimize risk of dependence and tolerance.  Patient advised to contact office with any questions, adverse effects, or acute worsening in signs and symptoms. Diagnoses and all orders for this visit:  Generalized anxiety disorder -     clonazePAM  (KLONOPIN ) 1 MG tablet; Take 1 tablet (1 mg total) by mouth 2 (two) times daily as needed for anxiety. -     FLUoxetine  (PROZAC ) 20 MG capsule; Take 1 capsule (20 mg total) by mouth daily.  Major depressive disorder, recurrent episode, moderate (HCC) -     FLUoxetine  (PROZAC ) 20 MG capsule; Take 1 capsule (20 mg total) by mouth daily.     Please see After Visit Summary for patient specific instructions.  Future Appointments  Date Time Provider Department Center  01/18/2024 10:00 AM Christel Cousins, MD [facility]    No orders of the defined types were placed in this encounter.     -------------------------------

## 2023-08-03 ENCOUNTER — Other Ambulatory Visit (HOSPITAL_COMMUNITY): Payer: Self-pay

## 2023-08-03 NOTE — Telephone Encounter (Signed)
Due to what he is using this medication for, the insurance will not approve. He can download [employer] and it will tell him on the app who has the best price.

## 2023-11-01 DIAGNOSIS — H4720 Unspecified optic atrophy: Secondary | ICD-10-CM | POA: Diagnosis not present

## 2023-11-01 DIAGNOSIS — H35342 Macular cyst, hole, or pseudohole, left eye: Secondary | ICD-10-CM | POA: Diagnosis not present

## 2023-11-01 DIAGNOSIS — H211X2 Other vascular disorders of iris and ciliary body, left eye: Secondary | ICD-10-CM | POA: Diagnosis not present

## 2023-11-01 DIAGNOSIS — H4050X2 Glaucoma secondary to other eye disorders, unspecified eye, moderate stage: Secondary | ICD-10-CM | POA: Diagnosis not present

## 2023-11-01 DIAGNOSIS — H3342 Traction detachment of retina, left eye: Secondary | ICD-10-CM | POA: Diagnosis not present

## 2023-11-01 DIAGNOSIS — H26492 Other secondary cataract, left eye: Secondary | ICD-10-CM | POA: Diagnosis not present

## 2024-01-16 ENCOUNTER — Other Ambulatory Visit: Payer: Self-pay | Admitting: Adult Health

## 2024-01-16 ENCOUNTER — Other Ambulatory Visit: Payer: Self-pay | Admitting: Family Medicine

## 2024-01-16 DIAGNOSIS — F411 Generalized anxiety disorder: Secondary | ICD-10-CM

## 2024-01-16 NOTE — Telephone Encounter (Signed)
 Please call patient to schedule FU, due now.

## 2024-01-17 NOTE — Telephone Encounter (Signed)
 Lvm for pt to call back and schedule

## 2024-01-18 ENCOUNTER — Encounter: Payer: Self-pay | Admitting: Family Medicine

## 2024-01-18 ENCOUNTER — Ambulatory Visit: Payer: BC Managed Care – PPO | Admitting: Family Medicine

## 2024-01-18 VITALS — BP 121/81 | HR 65 | Temp 97.3°F | Resp 16 | Ht 69.0 in | Wt 195.2 lb

## 2024-01-18 DIAGNOSIS — N3943 Post-void dribbling: Secondary | ICD-10-CM

## 2024-01-18 DIAGNOSIS — E7800 Pure hypercholesterolemia, unspecified: Secondary | ICD-10-CM | POA: Diagnosis not present

## 2024-01-18 DIAGNOSIS — N401 Enlarged prostate with lower urinary tract symptoms: Secondary | ICD-10-CM

## 2024-01-18 DIAGNOSIS — F418 Other specified anxiety disorders: Secondary | ICD-10-CM

## 2024-01-18 DIAGNOSIS — G250 Essential tremor: Secondary | ICD-10-CM | POA: Diagnosis not present

## 2024-01-18 DIAGNOSIS — I10 Essential (primary) hypertension: Secondary | ICD-10-CM | POA: Diagnosis not present

## 2024-01-18 DIAGNOSIS — Z125 Encounter for screening for malignant neoplasm of prostate: Secondary | ICD-10-CM

## 2024-01-18 MED ORDER — TAMSULOSIN HCL 0.4 MG PO CAPS: 0.4000 mg | ORAL_CAPSULE | Freq: Every day | ORAL | 3 refills | Status: DC

## 2024-01-18 MED ORDER — TOPIRAMATE 25 MG PO TABS: 25.0000 mg | ORAL_TABLET | Freq: Every evening | ORAL | 3 refills | Status: AC

## 2024-01-18 MED ORDER — EZETIMIBE 10 MG PO TABS: 10.0000 mg | ORAL_TABLET | Freq: Every day | ORAL | 3 refills | Status: AC

## 2024-01-18 NOTE — Patient Instructions (Signed)
 It was very nice to see you today!  Start tamsulosin when do labs [place] the urine   PLEASE NOTE:  If you had any lab tests please let us  know if you have not heard back within a few days. You may see your results on MyChart before we have a chance to review them but we will give you a call once they are reviewed by us . If we ordered any referrals today, please let us  know if you have not heard from their office within the next week.   Please try these tips to maintain a healthy lifestyle:  Eat most of your calories during the day when you are active. Eliminate processed foods including packaged sweets (pies, cakes, cookies), reduce intake of potatoes, white bread, white pasta, and white rice. Look for whole grain options, oat flour or almond flour.  Each meal should contain half fruits/vegetables, one quarter protein, and one quarter carbs (no bigger than a computer mouse).  Cut down on sweet beverages. This includes juice, soda, and sweet tea. Also watch fruit intake, though this is a healthier sweet option, it still contains natural sugar! Limit to 3 servings daily.  Drink at least 1 glass of water with each meal and aim for at least 8 glasses per day  Exercise at least 150 minutes every week.

## 2024-01-18 NOTE — Progress Notes (Signed)
 Subjective:     Patient ID: [name] [name], male    DOB: [birth year], 63 y.o.   MRN: [id number]  Chief Complaint  Patient presents with   Medical Management of Chronic Issues    6 month follow-up Not fasting     HPI-here w/partner [name] [facility],dep,tremor Dr. Gretchen Discussed the use of AI scribe software for clinical note transcription with the patient, who gave verbal consent to proceed.  History of Present Illness [name] [name] is a 63 year old male who presents for a routine follow-up visit.  He has hypertension managed with diltiazem  CD 120 mg daily, with stable blood pressure and no side effects. No cp/palp/cough/sob/edema  For hyperlipidemia, he takes Zetia  10 mg daily for over six months without adverse effects.  His depression and anxiety are stable with clonazepam  taken twice daily as needed and Prozac  20 mg daily. No suicidal ideation and he is satisfied with his psychiatric care.  He manages essential tremors with Topamax , which is effective w/o the side effects of the other meds he's tried.  He has mild asthma, leading to occasional mild coughing and congestion. Uses Mucinex if symptoms persist for a couple of days and has an inhaler from previous prescriptions, though not used regularly.  He reports stable vision with some improvement in his left eye following previous surgeries. 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  He experiences urinary symptoms such as dribbling, especially at night, and occasional urgency. Feels he empties his bladder completely but notices more leakage than before. Not currently seeing a urologist.  He recently enjoyed a trip to [place], which involved a lot of walking. 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 , Disp: , Rfl:    cyclobenzaprine  (FLEXERIL ) 10 MG tablet, Take 1 tablet (10 mg total) by mouth 3 (three) times daily as needed for muscle spasms., Disp: 45 tablet, Rfl: 3   diltiazem  (CARDIZEM  CD) 120 MG 24 hr capsule, Take 1 capsule (120 mg total) by mouth daily., Disp: 90 capsule, Rfl: 3   FLUoxetine  (PROZAC ) 20 MG capsule, Take 1 capsule (20 mg total) by mouth daily., Disp: 90 capsule, Rfl: 3   fluticasone (FLONASE) 50 MCG/ACT nasal spray, as needed., Disp: , Rfl:    latanoprost (XALATAN) 0.005 % ophthalmic solution, SMARTSIG:1 Drop(s) Left Eye Every Evening, Disp: , Rfl:    tamsulosin (FLOMAX) 0.4 MG CAPS capsule, Take 1 capsule (0.4 mg total) by mouth daily., Disp: 90 capsule, Rfl: 3   timolol (TIMOPTIC) 0.5 % ophthalmic solution, Place 1 drop into the left eye every morning., Disp: , Rfl:    tretinoin  (ALTRALIN) 0.05 % gel, Apply 1 Application topically at bedtime.,  Disp: 45 g, Rfl: 3   clonazePAM  (KLONOPIN ) 1 MG tablet, Take 1 tablet (1 mg total) by mouth 2 (two) times daily as needed for anxiety., Disp: 60 tablet, Rfl: 0   ezetimibe  (ZETIA ) 10 MG tablet, Take 1 tablet (10 mg total) by mouth daily., Disp: 90 tablet, Rfl: 3   topiramate  (TOPAMAX ) 25 MG tablet, Take 1 tablet (25 mg total) by mouth at bedtime., Disp: 90 tablet, Rfl: 3  Allergies  Allergen Reactions   Telithromycin Other (See Comments)    Other reaction(s): Unknown   ROS neg/noncontributory except as noted HPI/below      Objective:     BP 121/81 (BP Location:  Left Arm, Patient Position: Sitting, Cuff Size: Normal)   Pulse 65   Temp (!) 97.3 F (36.3 C) (Temporal)   Resp 16   Ht 5' 9 (1.753 m)   Wt 195 lb 4 oz (88.6 kg)   SpO2 96%   BMI 28.83 kg/m  Wt Readings from Last 3 Encounters:  01/18/24 195 lb 4 oz (88.6 kg)  07/21/23 198 lb 6 oz (90 kg)  01/18/23 191 lb 2 oz (86.7 kg)    Physical Exam   Gen: WDWN NAD HEENT: NCAT, conjunctiva not injected, sclera nonicteric NECK:  supple, no thyromegaly, no nodes, no carotid bruits CARDIAC: RRR, S1S2+, no murmur. DP 2+B LUNGS: CTAB. No wheezes ABDOMEN:  BS+, soft, NTND, No HSM, no masses EXT:  no edema MSK: no gross abnormalities.  NEURO: A&O x3.  CN II-XII intact. Mild tremor  PSYCH: normal mood. Good eye contact     Assessment & Plan:  Primary hypertension -     Urinalysis, Routine w reflex microscopic; Future -     CBC with Differential/Platelet; Future -     Comprehensive metabolic panel with GFR; Future  Pure hypercholesterolemia -     Ezetimibe ; Take 1 tablet (10 mg total) by mouth daily.  Dispense: 90 tablet; Refill: 3 -     Comprehensive metabolic panel with GFR; Future -     Lipid panel; Future  Benign familial tremor -     Topiramate ; Take 1 tablet (25 mg total) by mouth at bedtime.  Dispense: 90 tablet; Refill: 3  Anxiety with depression  Screening for prostate cancer -     PSA; Future  Benign prostatic hyperplasia with post-void dribbling -     Urinalysis, Routine w reflex microscopic; Future -     Tamsulosin HCl; Take 1 capsule (0.4 mg total) by mouth daily.  Dispense: 90 capsule; Refill: 3 -     PSA; Future  Assessment and Plan Assessment & Plan Urinary Incontinence   Urinary incontinence with dribbling, especially at night and in certain positions, suggests possible benign prostatic hyperplasia (BPH). Flomax is considered for management, with dizziness due to hypotension as a potential side effect. Schedule blood work for the last week of July, including a PSA  test. Consider starting Flomax after reviewing blood work results. Advise using tissue to wick urine post-urination to reduce dribbling.  Hypertension   Hypertension is well-managed with diltiazem  CD 120 mg daily. Continue current medication regimen.  Hyperlipidemia   Hyperlipidemia is well-managed with Zetia  10 mg daily. No side effects reported. Continue current medication regimen.  Depression and Anxiety   Depression and anxiety are well-controlled with clonazepam  as needed and Prozac  20 mg daily. No suicidal ideation reported. Continue current medication regimen.  Essential Tremor   Essential tremor is well-controlled with Topamax , with significant symptom improvement and no side  effects. Use Vitamin C if tingling occurs. Continue current medication regimen.  Mild Asthma   Mild asthma presents with occasional coughing and congestion. Uses Mucinex as needed and has an inhaler but does not use it regularly. Use Mucinex as needed and consider inhaler use if symptoms worsen.  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 Arley Ruder for retinal issues. Surgeries are considered as a preventive measure against potential vision loss in the good eye. Continue follow-up with Dr. Arley Ruder.  General Health Maintenance   Encouraged to maintain physical activity for overall health benefits. Regular walking and physical activity are recommended.    Return in about 6 months (around 07/20/2024) for chronic follow-up.    labs last wk of July.  Jenkins CHRISTELLA Carrel, MD

## 2024-01-24 DIAGNOSIS — D2272 Melanocytic nevi of left lower limb, including hip: Secondary | ICD-10-CM | POA: Diagnosis not present

## 2024-01-24 DIAGNOSIS — D2261 Melanocytic nevi of right upper limb, including shoulder: Secondary | ICD-10-CM | POA: Diagnosis not present

## 2024-01-24 DIAGNOSIS — D485 Neoplasm of uncertain behavior of skin: Secondary | ICD-10-CM | POA: Diagnosis not present

## 2024-01-24 DIAGNOSIS — D2262 Melanocytic nevi of left upper limb, including shoulder: Secondary | ICD-10-CM | POA: Diagnosis not present

## 2024-01-24 DIAGNOSIS — D225 Melanocytic nevi of trunk: Secondary | ICD-10-CM | POA: Diagnosis not present

## 2024-01-26 ENCOUNTER — Encounter: Payer: Self-pay | Admitting: Adult Health

## 2024-01-26 ENCOUNTER — Ambulatory Visit: Admitting: Adult Health

## 2024-01-26 DIAGNOSIS — F331 Major depressive disorder, recurrent, moderate: Secondary | ICD-10-CM

## 2024-01-26 DIAGNOSIS — F329 Major depressive disorder, single episode, unspecified: Secondary | ICD-10-CM | POA: Diagnosis not present

## 2024-01-26 DIAGNOSIS — F411 Generalized anxiety disorder: Secondary | ICD-10-CM | POA: Diagnosis not present

## 2024-01-26 MED ORDER — CLONAZEPAM 1 MG PO TABS: 1.0000 mg | ORAL_TABLET | Freq: Two times a day (BID) | ORAL | 2 refills | Status: AC | PRN

## 2024-01-26 NOTE — Progress Notes (Signed)
 [name] [name] [id number] 1961-06-23 63 y.o.  Virtual Visit via Telephone Note  I connected with pt on 01/26/24 at  8:30 AM EDT by telephone and verified that I am speaking with the correct person using two identifiers.   I discussed the limitations, risks, security and privacy concerns of performing an evaluation and management service by telephone and the availability of in person appointments. I also discussed with the patient that there may be a patient responsible charge related to this service. The patient expressed understanding and agreed to proceed.   I discussed the assessment and treatment plan with the patient. The patient was provided an opportunity to ask questions and all were answered. The patient agreed with the plan and demonstrated an understanding of the instructions.   The patient was advised to call back or seek an in-person evaluation if the symptoms worsen or if the condition fails to improve as anticipated.  I provided 15 minutes of non-face-to-face time during this encounter.  The patient was located at home.  The provider was located at [facility].  [name] LOISE Sayers, NP   Subjective:   Patient ID:  [name] [name] is a 63 y.o. (DOB Mar 23, 1961) male.  Chief Complaint: No chief complaint on file.   HPI [name] [name] presents for follow-up of GAD and MDD.   Describes mood today as ok. Pleasant. Denies tearfulness. Mood symptoms - reports some depression, anxiety, and irritability - having good and bad days. Reports stable interest and motivation. Denies panic attacks. Reports some worry, rumination and over thinking. Reports mood is stable. Stating I feel like I'm doing well. Feels like medications continue to work well. Taking medications as prescribed. Seeing therapist - [name] [name].  Energy levels stable. Active, has a regular exercise routine. Enjoys some usual interests and activities. Lives with partner. Spending time with family  and friends. Appetite adequate. Reports weight loss. Sleeps well most nights. Averages 6 to 8 hours. Focus and concentration stable. Completing tasks. Managing aspects of household. Retired. Denies SI or HI.  Denies AH or VH. Denies self harm. Denies substance use.  Review of Systems:  Review of Systems  Musculoskeletal:  Negative for gait problem.  Neurological:  Negative for tremors.  Psychiatric/Behavioral:         Please refer to HPI    Medications: I have reviewed the patient's current medications.  Current Outpatient Medications  Medication Sig Dispense Refill   cetirizine (ZYRTEC) 5 MG tablet Take 5 mg by mouth daily as needed for allergies.      clonazePAM  (KLONOPIN ) 1 MG tablet Take 1 tablet (1 mg total) by mouth 2 (two) times daily as needed for anxiety. 60 tablet 0   cyclobenzaprine  (FLEXERIL ) 10 MG tablet Take 1 tablet (10 mg total) by mouth 3 (three) times daily as needed for muscle spasms. 45 tablet 3   diltiazem  (CARDIZEM  CD) 120 MG 24 hr capsule Take 1 capsule (120 mg total) by mouth daily. 90 capsule 3   ezetimibe  (ZETIA ) 10 MG tablet Take 1 tablet (10 mg total) by mouth daily. 90 tablet 3   FLUoxetine  (PROZAC ) 20 MG capsule Take 1 capsule (20 mg total) by mouth daily. 90 capsule 3   fluticasone (FLONASE) 50 MCG/ACT nasal spray as needed.     latanoprost (XALATAN) 0.005 % ophthalmic solution SMARTSIG:1 Drop(s) Left Eye Every Evening     tamsulosin  (FLOMAX ) 0.4 MG CAPS capsule Take 1 capsule (0.4 mg total) by mouth daily. 90 capsule 3   timolol (TIMOPTIC) 0.5 %  ophthalmic solution Place 1 drop into the left eye every morning.     topiramate  (TOPAMAX ) 25 MG tablet Take 1 tablet (25 mg total) by mouth at bedtime. 90 tablet 3   tretinoin  (ALTRALIN) 0.05 % gel Apply 1 Application topically at bedtime. 45 g 3   No current facility-administered medications for this visit.    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    Family History  Problem Relation Age of Onset   Heart disease Mother 24   Hyperlipidemia Mother    ALS Mother     Social History   Socioeconomic History   Marital status: Single    Spouse name: [name] [name]   Number of children: 0   Years of education: Not on file   Highest education level: Bachelor's degree (e.g., BA, AB, BS)  Occupational History   Not on file  Tobacco Use   Smoking status: Never   Smokeless tobacco: Never  Vaping Use   Vaping status: Never Used  Substance and Sexual Activity   Alcohol use: Yes   Drug use: No   Sexual activity: Not on file  Other Topics Concern   Not on file  Social History Narrative   Retired Mudlogger      Long term partner [name] [name]   Social Drivers of Health   Financial Resource Strain: Low Risk  (01/17/2024)   Overall Financial Resource Strain (CARDIA)    Difficulty of Paying Living Expenses: Not very hard  Food Insecurity: No Food Insecurity (01/17/2024)   Hunger Vital Sign    Worried About Running Out of Food in the Last Year: Never true    Ran Out of Food in the Last Year: Never true  Transportation Needs: No Transportation Needs (01/17/2024)   PRAPARE - Administrator, Civil Service (Medical): No    Lack of Transportation (Non-Medical): No  Physical Activity: Insufficiently Active (01/17/2024)   Exercise Vital Sign    Days of Exercise per Week: 3 days    Minutes of Exercise per Session: 20 min  Stress: Stress Concern Present (01/17/2024)   [employer] of Occupational Health - Occupational Stress Questionnaire    Feeling of Stress: To some extent  Social Connections: Socially Integrated (01/17/2024)   Social  Connection and Isolation Panel    Frequency of Communication with Friends and Family: More than three times a week    Frequency of Social Gatherings with Friends and Family: More than three times a week    Attends Religious Services: More than 4 times per year    Active Member of [employer] or Organizations: Yes    Attends Engineer, structural: More than 4 times per year    Marital Status: Living with partner  Intimate Partner Violence: Not on file    Past Medical History, Surgical history, Social history, and Family history were reviewed and updated as appropriate.   Please see review of systems for further details on the patient's review from today.   Objective:   Physical Exam:  There were no vitals taken for this visit.  Physical Exam Constitutional:  General: He is not in acute distress. Musculoskeletal:        General: No deformity.  Neurological:     Mental Status: He is alert and oriented to person, place, and time.     Coordination: Coordination normal.  Psychiatric:        Attention and Perception: Attention and perception normal. He does not perceive auditory or visual hallucinations.        Mood and Affect: Mood normal. Mood is not anxious or depressed. Affect is not labile, blunt, angry or inappropriate.        Speech: Speech normal.        Behavior: Behavior normal.        Thought Content: Thought content normal. Thought content is not paranoid or delusional. Thought content does not include homicidal or suicidal ideation. Thought content does not include homicidal or suicidal plan.        Cognition and Memory: Cognition and memory normal.        Judgment: Judgment normal.     Comments: Insight intact    Lab Review:     Component Value Date/Time   NA 138 07/21/2023 0855   K 4.4 07/21/2023 0855   CL 105 07/21/2023 0855   CO2 24 07/21/2023 0855   GLUCOSE 98 07/21/2023 0855   BUN 15 07/21/2023 0855   CREATININE 0.92 07/21/2023 0855   CALCIUM 9.5  07/21/2023 0855   PROT 7.4 07/21/2023 0855   ALBUMIN 4.5 07/21/2023 0855   AST 20 07/21/2023 0855   ALT 24 07/21/2023 0855   ALKPHOS 74 07/21/2023 0855   BILITOT 0.5 07/21/2023 0855   GFRNONAA >60 06/13/2016 1117   GFRAA >60 06/13/2016 1117       Component Value Date/Time   WBC 13.0 (H) 07/21/2023 0855   RBC 5.48 07/21/2023 0855   HGB 16.7 07/21/2023 0855   HGB 17.4 (H) 09/26/2011 1016   HCT 49.3 07/21/2023 0855   HCT 51.1 (H) 09/26/2011 1016   PLT 296.0 07/21/2023 0855   PLT 277 09/26/2011 1016   MCV 90.1 07/21/2023 0855   MCV 87.0 09/26/2011 1016   MCH 30.0 06/13/2016 1117   MCHC 33.9 07/21/2023 0855   RDW 13.3 07/21/2023 0855   RDW 12.6 09/26/2011 1016   LYMPHSABS 1.6 07/21/2023 0855   LYMPHSABS 2.2 09/26/2011 1016   MONOABS 0.9 07/21/2023 0855   MONOABS 0.5 09/26/2011 1016   EOSABS 0.1 07/21/2023 0855   EOSABS 0.0 09/26/2011 1016   BASOSABS 0.0 07/21/2023 0855   BASOSABS 0.1 09/26/2011 1016    No results found for: POCLITH, LITHIUM   No results found for: PHENYTOIN, PHENOBARB, VALPROATE, CBMZ   .res Assessment: Plan:    Plan:  Prozac  20mg  daily Clonazepam  1mg  daily  RTC 6 months  15 minutes spent dedicated to the care of this patient on the date of this encounter to include pre-visit review of records, ordering of medication, post visit documentation, and face-to-face time with the patient discussing depression and anxiety. Discussed continuing current medication regimen.  Discussed potential benefits, risks, and side effects of stimulants with patient to include increased heart rate, palpitations, insomnia, increased anxiety, increased irritability, or decreased appetite.  Instructed patient to contact office if experiencing any significant tolerability issues.  Discussed potential benefits, risk, and side effects of benzodiazepines to include potential risk of tolerance and dependence, as well as possible drowsiness. Advised patient not to  drive if experiencing drowsiness and to take lowest possible effective dose to minimize risk of dependence and  tolerance.  Patient advised to contact office with any questions, adverse effects, or acute worsening in signs and symptoms.  There are no diagnoses linked to this encounter.   Please see After Visit Summary for patient specific instructions.  Future Appointments  Date Time Provider Department Center  01/26/2024  8:30 AM [facility]ag[facility] Harvie, [name] Mattocks, NP [facility] None  02/13/2024  9:15 AM [facility]  07/23/2024  8:30 AM Wendolyn Jenkins Jansky, MD [facility]    No orders of the defined types were placed in this encounter.     -------------------------------

## 2024-02-13 ENCOUNTER — Other Ambulatory Visit (INDEPENDENT_AMBULATORY_CARE_PROVIDER_SITE_OTHER)

## 2024-02-13 ENCOUNTER — Ambulatory Visit: Payer: Self-pay | Admitting: Family Medicine

## 2024-02-13 DIAGNOSIS — Z125 Encounter for screening for malignant neoplasm of prostate: Secondary | ICD-10-CM

## 2024-02-13 DIAGNOSIS — I10 Essential (primary) hypertension: Secondary | ICD-10-CM

## 2024-02-13 DIAGNOSIS — E7800 Pure hypercholesterolemia, unspecified: Secondary | ICD-10-CM | POA: Diagnosis not present

## 2024-02-13 DIAGNOSIS — N3943 Post-void dribbling: Secondary | ICD-10-CM

## 2024-02-13 DIAGNOSIS — N401 Enlarged prostate with lower urinary tract symptoms: Secondary | ICD-10-CM

## 2024-02-13 LAB — CBC WITH DIFFERENTIAL/PLATELET
Basophils Absolute: 0 K/uL (ref 0.0–0.1)
Basophils Relative: 0.5 % (ref 0.0–3.0)
Eosinophils Absolute: 0.1 K/uL (ref 0.0–0.7)
Eosinophils Relative: 2 % (ref 0.0–5.0)
HCT: 48.1 % (ref 39.0–52.0)
Hemoglobin: 16.3 g/dL (ref 13.0–17.0)
Lymphocytes Relative: 38 % (ref 12.0–46.0)
Lymphs Abs: 2.5 K/uL (ref 0.7–4.0)
MCHC: 34 g/dL (ref 30.0–36.0)
MCV: 87.2 fl (ref 78.0–100.0)
Monocytes Absolute: 0.4 K/uL (ref 0.1–1.0)
Monocytes Relative: 6.7 % (ref 3.0–12.0)
Neutro Abs: 3.5 K/uL (ref 1.4–7.7)
Neutrophils Relative %: 52.8 % (ref 43.0–77.0)
Platelets: 285 K/uL (ref 150.0–400.0)
RBC: 5.52 Mil/uL (ref 4.22–5.81)
RDW: 13.2 % (ref 11.5–15.5)
WBC: 6.7 K/uL (ref 4.0–10.5)

## 2024-02-13 LAB — URINALYSIS, ROUTINE W REFLEX MICROSCOPIC
Bilirubin Urine: NEGATIVE
Hgb urine dipstick: NEGATIVE
Ketones, ur: NEGATIVE
Leukocytes,Ua: NEGATIVE
Nitrite: NEGATIVE
RBC / HPF: NONE SEEN (ref 0–?)
Specific Gravity, Urine: >=1.03 — AB (ref 1.000–1.030)
Total Protein, Urine: NEGATIVE
Urine Glucose: NEGATIVE
Urobilinogen, UA: 0.2 (ref 0.0–1.0)
pH: 6 (ref 5.0–8.0)

## 2024-02-13 LAB — LIPID PANEL
Cholesterol: 219 mg/dL — ABNORMAL HIGH (ref 0–200)
HDL: 51.5 mg/dL (ref >39.00)
LDL Cholesterol: 138 mg/dL — ABNORMAL HIGH (ref 0–99)
NonHDL: 167.78
Total CHOL/HDL Ratio: 4
Triglycerides: 150 mg/dL — ABNORMAL HIGH (ref 0.0–149.0)
VLDL: 30 mg/dL (ref 0.0–40.0)

## 2024-02-13 LAB — COMPREHENSIVE METABOLIC PANEL WITH GFR
ALT: 20 U/L (ref 0–53)
AST: 13 U/L (ref 0–37)
Albumin: 4.4 g/dL (ref 3.5–5.2)
Alkaline Phosphatase: 68 U/L (ref 39–117)
BUN: 12 mg/dL (ref 6–23)
CO2: 27 meq/L (ref 19–32)
Calcium: 9.2 mg/dL (ref 8.4–10.5)
Chloride: 105 meq/L (ref 96–112)
Creatinine, Ser: 0.85 mg/dL (ref 0.40–1.50)
GFR: 92.34 mL/min (ref >60.00)
Glucose, Bld: 93 mg/dL (ref 70–99)
Potassium: 4.4 meq/L (ref 3.5–5.1)
Sodium: 139 meq/L (ref 135–145)
Total Bilirubin: 0.8 mg/dL (ref 0.2–1.2)
Total Protein: 6.8 g/dL (ref 6.0–8.3)

## 2024-02-13 LAB — PSA: PSA: 3.08 ng/mL (ref 0.10–4.00)

## 2024-02-13 NOTE — Progress Notes (Signed)
 Cholesterol could be better.  Continue zetia  and work on diet/exercise Urine ok.  If not drinking enough water, needs to! Rest of labs ok

## 2024-05-01 ENCOUNTER — Encounter: Payer: Self-pay | Admitting: Ophthalmology

## 2024-05-01 DIAGNOSIS — H211X2 Other vascular disorders of iris and ciliary body, left eye: Secondary | ICD-10-CM | POA: Diagnosis not present

## 2024-05-01 DIAGNOSIS — H4050X2 Glaucoma secondary to other eye disorders, unspecified eye, moderate stage: Secondary | ICD-10-CM | POA: Diagnosis not present

## 2024-05-01 DIAGNOSIS — H26492 Other secondary cataract, left eye: Secondary | ICD-10-CM | POA: Diagnosis not present

## 2024-05-01 DIAGNOSIS — H35342 Macular cyst, hole, or pseudohole, left eye: Secondary | ICD-10-CM | POA: Diagnosis not present

## 2024-05-01 DIAGNOSIS — H4720 Unspecified optic atrophy: Secondary | ICD-10-CM | POA: Diagnosis not present

## 2024-05-01 LAB — OPHTHALMOLOGY REPORT-SCANNED

## 2024-06-17 ENCOUNTER — Encounter: Payer: Self-pay | Admitting: Internal Medicine

## 2024-07-01 ENCOUNTER — Ambulatory Visit

## 2024-07-01 VITALS — Ht 69.0 in | Wt 197.8 lb

## 2024-07-01 DIAGNOSIS — Z1211 Encounter for screening for malignant neoplasm of colon: Secondary | ICD-10-CM

## 2024-07-01 MED ORDER — NA SULFATE-K SULFATE-MG SULF 17.5-3.13-1.6 GM/177ML PO SOLN: 1.0000 | Freq: Once | ORAL | 0 refills | Status: AC

## 2024-07-01 NOTE — Progress Notes (Signed)
 No egg or soy allergy known to patient  No issues known to pt with past sedation with any surgeries or procedures- nausea Patient denies ever being told they had issues or difficulty with intubation  No FH of Malignant Hyperthermia Pt is not on diet pills Pt is not on  home 02  Pt is not on blood thinners  Pt denies issues with constipation  No A fib or A flutter Have any cardiac testing pending--NO Pt can ambulate-independently Pt denies use of chewing tobacco Discussed diabetic I weight loss medication holds Discussed NSAID holds Checked BMI Pt instructed to use Singlecare.com or GoodRx for a price reduction on prep  Patient's chart reviewed by Norleen Schillings CNRA prior to previsit and patient appropriate for the [facility].  Pre visit completed and red dot placed by patient's name on their procedure day (on provider's schedule).

## 2024-07-05 ENCOUNTER — Encounter: Payer: Self-pay | Admitting: Internal Medicine

## 2024-07-16 ENCOUNTER — Ambulatory Visit: Admitting: Internal Medicine

## 2024-07-16 ENCOUNTER — Encounter: Payer: Self-pay | Admitting: Internal Medicine

## 2024-07-16 VITALS — BP 105/68 | HR 83 | Temp 97.4°F | Resp 17 | Ht 69.0 in | Wt 197.8 lb

## 2024-07-16 DIAGNOSIS — D122 Benign neoplasm of ascending colon: Secondary | ICD-10-CM

## 2024-07-16 DIAGNOSIS — D120 Benign neoplasm of cecum: Secondary | ICD-10-CM

## 2024-07-16 DIAGNOSIS — K6289 Other specified diseases of anus and rectum: Secondary | ICD-10-CM

## 2024-07-16 DIAGNOSIS — Z1211 Encounter for screening for malignant neoplasm of colon: Secondary | ICD-10-CM | POA: Diagnosis present

## 2024-07-16 DIAGNOSIS — K5730 Diverticulosis of large intestine without perforation or abscess without bleeding: Secondary | ICD-10-CM

## 2024-07-16 DIAGNOSIS — K648 Other hemorrhoids: Secondary | ICD-10-CM | POA: Diagnosis not present

## 2024-07-16 MED ORDER — SODIUM CHLORIDE 0.9 % IV SOLN: 500.0000 mL | Freq: Once | INTRAVENOUS | Status: DC

## 2024-07-16 NOTE — Progress Notes (Signed)
 Called to room to assist during endoscopic procedure.  Patient ID and intended procedure confirmed with present staff. Received instructions for my participation in the procedure from the performing physician.

## 2024-07-16 NOTE — Progress Notes (Signed)
 HISTORY OF PRESENT ILLNESS:  [name] [name] is a 63 y.o. male presents for screening colonoscopy.  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 He has never used smokeless tobacco. He reports that he does not currently use alcohol. He reports that he does not use drugs.  family history includes ALS in his mother; Heart disease (age of onset: 61) in his mother; Hyperlipidemia in his mother.  Allergies[1]     PHYSICAL EXAMINATION: Vital signs: BP 130/81   Pulse (!) 102   Temp (!) 97.4 F (36.3 C) (Skin)   Ht 5' 9 (1.753 m)   Wt 197 lb 12.8 oz (89.7 kg)   SpO2 97%   BMI 29.21 kg/m  General: Well-developed, well-nourished, no acute distress HEENT: Sclerae are anicteric, conjunctiva pink. Oral mucosa intact Lungs: Clear Heart: Regular Abdomen: soft, nontender, nondistended, no obvious ascites, no peritoneal signs, normal bowel sounds. No organomegaly. Extremities: No edema Psychiatric: alert and oriented x3. Cooperative     ASSESSMENT:  Colon cancer screening   PLAN:  Screening colonoscopy         [1]  Allergies Allergen Reactions   Telithromycin Other (See Comments)    Other reaction(s): Unknown

## 2024-07-16 NOTE — Op Note (Signed)
 [facility] Patient Name: [name] [name] Procedure Date: 07/16/2024 11:37 AM MRN: [id number] Endoscopist: Norleen SAILOR. Abran , MD, [id number] Age: 63 Referring MD:  Date of Birth: Mar 26, 1961 Gender: Male Account #: [id number] Procedure:                Colonoscopy with cold snare polypectomy x 3 Indications:              Screening for colorectal malignant neoplasm.                            Previous examination 2012 was normal Medicines:                Monitored Anesthesia Care Procedure:                Pre-Anesthesia Assessment:                           - Prior to the procedure, a History and Physical                            was performed, and patient medications and                            allergies were reviewed. The patient's tolerance of                            previous anesthesia was also reviewed. The risks                            and benefits of the procedure and the sedation                            options and risks were discussed with the patient.                            All questions were answered, and informed consent                            was obtained. Prior Anticoagulants: The patient has                            taken no anticoagulant or antiplatelet agents. ASA                            Grade Assessment: II - A patient with mild systemic                            disease. After reviewing the risks and benefits,                            the patient was deemed in satisfactory condition to                            undergo the procedure.  After obtaining informed consent, the colonoscope                            was passed under direct vision. Throughout the                            procedure, the patient's blood pressure, pulse, and                            oxygen saturations were monitored continuously. The                            CF [id number] #[id number] was introduced through the anus                             and advanced to the the cecum, identified by                            appendiceal orifice and ileocecal valve. The                            ileocecal valve, appendiceal orifice, and rectum                            were photographed. The quality of the bowel                            preparation was excellent. The colonoscopy was                            performed without difficulty. The patient tolerated                            the procedure well. The bowel preparation used was                            SUPREP via split dose instruction. Scope In: 11:43:03 AM Scope Out: 11:57:10 AM Scope Withdrawal Time: 0 hours 12 minutes 35 seconds  Total Procedure Duration: 0 hours 14 minutes 7 seconds  Findings:                 Three polyps were found in the ascending colon and                            cecum. The polyps were 3 to 12 mm in size. These                            polyps were removed with a cold snare. Resection                            and retrieval were complete.                           A few diverticula were found  in the left colon.                           Internal hemorrhoids were found during                            retroflexion. Hypertrophic anal papilla.                           The exam was otherwise without abnormality on                            direct and retroflexion views. Complications:            No immediate complications. Estimated blood loss:                            None. Estimated Blood Loss:     Estimated blood loss: none. Impression:               - Three 3 to 12 mm polyps in the ascending colon                            and in the cecum, removed with a cold snare.                            Resected and retrieved.                           - Diverticulosis in the left colon.                           - Internal hemorrhoids.                           - The examination was otherwise normal on direct                             and retroflexion views. Recommendation:           - Repeat colonoscopy in 3 years for surveillance.                           - Patient has a contact number available for                            emergencies. The signs and symptoms of potential                            delayed complications were discussed with the                            patient. Return to normal activities tomorrow.                            Written discharge instructions were provided to the  patient.                           - Resume previous diet.                           - Continue present medications.                           - Await pathology results. Norleen SAILOR. Abran, MD 07/16/2024 12:06:40 PM This report has been signed electronically.

## 2024-07-16 NOTE — Patient Instructions (Signed)
 Repeat colonoscopy in 3 years for surveillance.   Resume previous diet.  Continue present medications.  Await pathology results.   YOU HAD AN ENDOSCOPIC PROCEDURE TODAY AT THE [facility]:   Refer to the procedure report that was given to you for any specific questions about what was found during the examination.  If the procedure report does not answer your questions, please call your gastroenterologist to clarify.  If you requested that your care partner not be given the details of your procedure findings, then the procedure report has been included in a sealed envelope for you to review at your convenience later.  YOU SHOULD EXPECT: Some feelings of bloating in the abdomen. Passage of more gas than usual.  Walking can help get rid of the air that was put into your GI tract during the procedure and reduce the bloating. If you had a lower endoscopy (such as a colonoscopy or flexible sigmoidoscopy) you may notice spotting of blood in your stool or on the toilet paper. If you underwent a bowel prep for your procedure, you may not have a normal bowel movement for a few days.  Please Note:  You might notice some irritation and congestion in your nose or some drainage.  This is from the oxygen used during your procedure.  There is no need for concern and it should clear up in a day or so.  SYMPTOMS TO REPORT IMMEDIATELY:  Following lower endoscopy (colonoscopy or flexible sigmoidoscopy):  Excessive amounts of blood in the stool  Significant tenderness or worsening of abdominal pains  Swelling of the abdomen that is new, acute  Fever of 100F or higher  For urgent or emergent issues, a gastroenterologist can be reached at any hour by calling [phone]. Do not use MyChart messaging for urgent concerns.    DIET:  We do recommend a small meal at first, but then you may proceed to your regular diet.  Drink plenty of fluids but you should avoid alcoholic beverages for 24  hours.  ACTIVITY:  You should plan to take it easy for the rest of today and you should NOT DRIVE or use heavy machinery until tomorrow (because of the sedation medicines used during the test).    FOLLOW UP: Our staff will call the number listed on your records the next business day following your procedure.  We will call around 7:15- 8:00 am to check on you and address any questions or concerns that you may have regarding the information given to you following your procedure. If we do not reach you, we will leave a message.     If any biopsies were taken you will be contacted by phone or by letter within the next 1-3 weeks.  Please call us  at [phone] if you have not heard about the biopsies in 3 weeks.    SIGNATURES/CONFIDENTIALITY: You and/or your care partner have signed paperwork which will be entered into your electronic medical record.  These signatures attest to the fact that that the information above on your After Visit Summary has been reviewed and is understood.  Full responsibility of the confidentiality of this discharge information lies with you and/or your care-partner.

## 2024-07-16 NOTE — Progress Notes (Signed)
Pt. states no medical or surgical changes since previsit or office visit. 

## 2024-07-16 NOTE — Progress Notes (Signed)
 Transferred to PACU via stretcher.  Not responding to stimulation at this time.  VSS upon leaving procedure room.

## 2024-07-19 ENCOUNTER — Ambulatory Visit: Payer: Self-pay | Admitting: Internal Medicine

## 2024-07-19 ENCOUNTER — Telehealth: Payer: Self-pay

## 2024-07-19 LAB — SURGICAL PATHOLOGY

## 2024-07-19 NOTE — Telephone Encounter (Signed)
" °  Follow up Call-     07/16/2024   11:06 AM  Call back number  Post procedure Call Back phone  # [phone]  Permission to leave phone message Yes     Patient questions:  Do you have a fever, pain , or abdominal swelling? No. Pain Score  0 *  Have you tolerated food without any problems? Yes.    Have you been able to return to your normal activities? Yes.    Do you have any questions about your discharge instructions: Diet   No. Medications  No. Follow up visit  No.  Do you have questions or concerns about your Care? No.  Actions: * If pain score is 4 or above: No action needed, pain <4.   "

## 2024-07-22 ENCOUNTER — Encounter: Admitting: Internal Medicine

## 2024-07-23 ENCOUNTER — Ambulatory Visit: Payer: Self-pay | Admitting: Family Medicine

## 2024-07-23 ENCOUNTER — Ambulatory Visit (INDEPENDENT_AMBULATORY_CARE_PROVIDER_SITE_OTHER): Admitting: Family Medicine

## 2024-07-23 ENCOUNTER — Encounter: Payer: Self-pay | Admitting: Family Medicine

## 2024-07-23 VITALS — BP 118/74 | HR 88 | Temp 97.2°F | Ht 69.0 in | Wt 198.2 lb

## 2024-07-23 DIAGNOSIS — Z0000 Encounter for general adult medical examination without abnormal findings: Secondary | ICD-10-CM

## 2024-07-23 DIAGNOSIS — E7800 Pure hypercholesterolemia, unspecified: Secondary | ICD-10-CM

## 2024-07-23 DIAGNOSIS — G250 Essential tremor: Secondary | ICD-10-CM

## 2024-07-23 DIAGNOSIS — I10 Essential (primary) hypertension: Secondary | ICD-10-CM | POA: Diagnosis not present

## 2024-07-23 LAB — COMPREHENSIVE METABOLIC PANEL WITH GFR
ALT: 21 U/L (ref 3–53)
AST: 15 U/L (ref 5–37)
Albumin: 4.1 g/dL (ref 3.5–5.2)
Alkaline Phosphatase: 68 U/L (ref 39–117)
BUN: 13 mg/dL (ref 6–23)
CO2: 26 meq/L (ref 19–32)
Calcium: 9 mg/dL (ref 8.4–10.5)
Chloride: 106 meq/L (ref 96–112)
Creatinine, Ser: 0.84 mg/dL (ref 0.40–1.50)
GFR: 92.38 mL/min
Glucose, Bld: 109 mg/dL — ABNORMAL HIGH (ref 70–99)
Potassium: 3.7 meq/L (ref 3.5–5.1)
Sodium: 138 meq/L (ref 135–145)
Total Bilirubin: 0.6 mg/dL (ref 0.2–1.2)
Total Protein: 6.8 g/dL (ref 6.0–8.3)

## 2024-07-23 LAB — CBC WITH DIFFERENTIAL/PLATELET
Basophils Absolute: 0 K/uL (ref 0.0–0.1)
Basophils Relative: 0.4 % (ref 0.0–3.0)
Eosinophils Absolute: 0.1 K/uL (ref 0.0–0.7)
Eosinophils Relative: 1.8 % (ref 0.0–5.0)
HCT: 46 % (ref 39.0–52.0)
Hemoglobin: 15.9 g/dL (ref 13.0–17.0)
Lymphocytes Relative: 32.4 % (ref 12.0–46.0)
Lymphs Abs: 2.4 K/uL (ref 0.7–4.0)
MCHC: 34.5 g/dL (ref 30.0–36.0)
MCV: 86.4 fl (ref 78.0–100.0)
Monocytes Absolute: 0.5 K/uL (ref 0.1–1.0)
Monocytes Relative: 7.2 % (ref 3.0–12.0)
Neutro Abs: 4.2 K/uL (ref 1.4–7.7)
Neutrophils Relative %: 58.2 % (ref 43.0–77.0)
Platelets: 285 K/uL (ref 150.0–400.0)
RBC: 5.33 Mil/uL (ref 4.22–5.81)
RDW: 13 % (ref 11.5–15.5)
WBC: 7.3 K/uL (ref 4.0–10.5)

## 2024-07-23 LAB — HEMOGLOBIN A1C: Hgb A1c MFr Bld: 5.3 % (ref 4.6–6.5)

## 2024-07-23 LAB — LIPID PANEL
Cholesterol: 201 mg/dL — ABNORMAL HIGH (ref 28–200)
HDL: 43.7 mg/dL
LDL Cholesterol: 102 mg/dL — ABNORMAL HIGH (ref 10–99)
NonHDL: 156.97
Total CHOL/HDL Ratio: 5
Triglycerides: 274 mg/dL — ABNORMAL HIGH (ref 10.0–149.0)
VLDL: 54.8 mg/dL — ABNORMAL HIGH (ref 0.0–40.0)

## 2024-07-23 LAB — TSH: TSH: 1.17 u[IU]/mL (ref 0.35–5.50)

## 2024-07-23 MED ORDER — TRETINOIN 0.05 % EX GEL: 1.0000 | Freq: Every day | CUTANEOUS | 3 refills | Status: AC

## 2024-07-23 NOTE — Progress Notes (Signed)
 " Phone: [phone]   Subjective:  Patient 64 y.o. male presenting for annual physical.  Chief Complaint  Patient presents with   Annual Exam    Pt is here for CPE    Discussed the use of AI scribe software for clinical note transcription with the patient, who gave verbal consent to proceed.  History of Present Illness [name] [name] is a 64 year old male who presents for a routine follow-up visit/annual physical.  He manages anxiety with clonazepam  and fluoxetine , though he plans to switch from fluoxetine  to another medication like Effexor soon. He feels better mentally than he has in years, despite the challenges of adjusting to retirement and managing his eye condition.  He takes diltiazem  120 mg for hypertension. He also takes Zetia  10 mg for cholesterol management due to a history of muscle aches and a family history of ALS, leading him to avoid statins.  He uses Topamax  25 mg for tremors, which he finds effective without side effects, taking it every couple of days as needed. He also uses eye drops for glaucoma and takes Flexeril  as needed for muscle relaxation.  He experiences occasional heart racing but no associated side effects. He struggles with periodic diarrhea, which he manages with dietary fiber intake. No regular headaches, chest pain, or respiratory issues.  He is retired and has structured his life with volunteer work and church activities. He acknowledges the need to increase physical activity. He does not consume alcohol or smoke.  He has a history of skin issues and sees a dermatologist annually. He uses tretinoin  for skin care, though he needs a refill.  Not exercising.  Just had cscope-1 polyp-repeat 3 yrs    See problem oriented charting- ROS- ROS: Gen: no fever, chills  Skin: no rash, itching ENT: no ear pain, ear drainage, nasal congestion, rhinorrhea, sinus pressure, sore throat Eyes: no blurry vision, double vision Resp: no cough, wheeze,SOB CV:  no CP, , LE edema,   occ palp GI: no heartburn, n/v/c, abd pain   intermitt diarrhea GU: no dysuria, urgency, frequency, hematuria MSK: no joint pain, myalgias, back pain Neuro: no dizziness, headache, weakness, vertigo Psych: no , SI .  Stable.  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  clonazePAM  (KLONOPIN ) 1 MG tablet Take 1 tablet (1 mg total) by mouth 2 (two) times daily as needed for anxiety. 60 tablet 2   cyclobenzaprine  (FLEXERIL ) 10 MG tablet Take 1 tablet (10 mg total) by mouth 3 (three) times daily as needed for muscle spasms. 45 tablet 3   diltiazem  (CARDIZEM  CD) 120 MG 24 hr capsule Take 1 capsule (120 mg total) by mouth daily. 90 capsule 3   ezetimibe  (ZETIA ) 10 MG tablet Take 1 tablet (10 mg total) by mouth daily. 90 tablet 3   FLUoxetine  (PROZAC ) 20 MG capsule Take 1 capsule (20 mg total) by  mouth daily. 90 capsule 3   fluticasone (FLONASE) 50 MCG/ACT nasal spray as needed.     PREVIDENT 5000 ENAMEL PROTECT 1.1-5 % GEL Brush normally for at least two minutes twice daily; do not rinse after brushing     timolol (TIMOPTIC) 0.5 % ophthalmic solution Place 1 drop into the left eye every morning.     topiramate  (TOPAMAX ) 25 MG tablet Take 1 tablet (25 mg total) by mouth at bedtime. 90 tablet 3   tretinoin  (ALTRALIN) 0.05 % gel Apply 1 Application topically at bedtime. 45 g 3   No current facility-administered medications for this visit.    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 + chronic horiz nystagmus TM WNL B, OP moist, no exudates  NECK:  supple, no thyromegaly, no nodes, no carotid bruits CARDIAC: RRR, S1S2+, no murmur. DP 2+B LUNGS: CTAB. No wheezes ABDOMEN:  BS+, soft, NTND, No HSM, no masses EXT:  no edema MSK: no gross abnormalities. MS 5/5 all 4 NEURO: A&O x3.  CN II-XII intact.  PSYCH: normal mood. Good eye contact     Assessment and Plan   Health Maintenance counseling: 1. Anticipatory guidance: Patient counseled regarding regular dental exams q6 months, eye exams yearly, avoiding smoking and second hand smoke, limiting alcohol to 2 beverages per day.   2. Risk factor reduction:  Advised patient of need for regular exercise and diet rich in fruits and vegetables to reduce risk of heart attack and stroke. Exercise- encouraged.   Wt Readings from Last 3 Encounters:  07/23/24 198 lb 4 oz (89.9 kg)  07/16/24 197 lb 12.8 oz (89.7 kg)   07/01/24 197 lb 12.8 oz (89.7 kg)   3. Immunizations/screenings/ancillary studies Immunization History  Administered Date(s) Administered   DTaP, 5 pertussis antigens 07/16/2018   Influenza Split 04/18/2011   Influenza, Quadrivalent, Recombinant, Inj, Pf 03/28/2019   Influenza, Seasonal, Injecte, Preservative Fre 03/25/2024   Influenza,inj,Quad PF,6+ Mos 04/07/2015   Influenza-Unspecified 04/23/2017, 03/25/2021, 03/24/2022   Moderna Covid-19 Vaccine Bivalent Booster 65yrs & up 11/10/2021   PFIZER(Purple Top)SARS-COV-2 Vaccination 10/03/2019, 10/23/2019, 11/15/2019, 04/23/2020, 10/29/2020, 03/25/2021   Pfizer(Comirnaty)Fall Seasonal Vaccine 12 years and older 03/24/2023   Pneumococcal Conjugate-13 05/31/2017   Pneumococcal Polysaccharide-23 09/16/1999, 09/15/2005, 07/05/2018   RSV,unspecified 03/24/2022   Tdap 10/17/2007, 01/18/2023   Unspecified SARS-COV-2 Vaccination 04/01/2022   Zoster Recombinant(Shingrix) 11/17/2021, 01/28/2022  Health Maintenance Due  Topic Date Due   HIV Screening  Never done   Hepatitis C Screening  Never done   Pneumococcal Vaccine: 50+ Years (3 of 3 - PCV20 or PCV21) 07/06/2023    4. Prostate cancer screening >55yo - risk factors?  Lab Results  Component Value Date   PSA 3.08 02/13/2024    5. Colon cancer screening:due 3 yrs 6. Skin cancer screening- Iadvised regular sunscreen use. Denies worrisome, changing, or new skin lesions.  7. Smoking associated screening (lung cancer screening, AAA screen 65-75, UA)- non smoker  Wellness examination -     Lipid panel -     Comprehensive metabolic panel with GFR -     CBC with Differential/Platelet -     Hemoglobin A1c -     TSH  Primary hypertension -     Comprehensive metabolic panel with GFR -     CBC with Differential/Platelet -     Hemoglobin A1c -     TSH  Pure hypercholesterolemia -     Lipid panel -     Comprehensive metabolic panel with GFR -     TSH  Other orders -     Tretinoin ;  Apply 1 Application topically at bedtime.  Dispense: 45 g; Refill: 3    Assessment and Plan Assessment & Plan Primary hypertension   Blood pressure is well-controlled with diltiazem  120 mg. He experiences no dizziness, lightheadedness, or headaches. Continue diltiazem  120 mg daily.  Pure hypercholesterolemia   Managed with Zetia  10 mg due to muscle response to statins. Cholesterol management is stable. Continue Zetia  10 mg daily.  Glaucoma   Managed with eye drops and no new symptoms are reported. Continue current eye drop regimen.  Tremor   Well-controlled with Topamax  25 mg, taken as needed every couple of days. No side effects are reported. Continue Topamax  25 mg as needed.  Depression   Managed with clonazepam  and fluoxetine . Plans to switch from fluoxetine  to Effexor due to diminishing effectiveness. Mental health is improving. Switch from fluoxetine  to Effexor and continue clonazepam  as prescribed.  Chronic diarrhea   Intermittent diarrhea is managed with dietary modifications, including increased fiber intake. No constant symptoms are reported. Increase dietary fiber intake and consider probiotics if needed.  General Health Maintenance   Routine health maintenance discussed, including exercise and dietary habits. He is encouraged to stay active and maintain a healthy diet. Encourage regular exercise and a balanced diet with adequate fiber.     Recommended follow up: Return in about 6 months (around 01/20/2025) for chronic follow-up.  Lab/Order associations:+ fasting   Jenkins CHRISTELLA Carrel, MD     [1]  Allergies Allergen Reactions   Telithromycin Other (See Comments)    Other reaction(s): Unknown   "

## 2024-07-23 NOTE — Progress Notes (Signed)
 Labs great except trigs elevated-work more on diet/exercise

## 2024-07-23 NOTE — Patient Instructions (Signed)

## 2024-07-24 NOTE — Progress Notes (Signed)
Pt has read results.

## 2025-01-21 ENCOUNTER — Ambulatory Visit: Admitting: Family Medicine
